# Patient Record
Sex: Female | Born: 1935
Health system: Southern US, Community
[De-identification: ages and names within clinical notes are randomized; demographics above are authoritative.]

## PROBLEM LIST (undated history)

## (undated) DIAGNOSIS — R638 Other symptoms and signs concerning food and fluid intake: Secondary | ICD-10-CM

## (undated) DIAGNOSIS — N189 Chronic kidney disease, unspecified: Secondary | ICD-10-CM

## (undated) DIAGNOSIS — N816 Rectocele: Secondary | ICD-10-CM

## (undated) DIAGNOSIS — K5790 Diverticulosis of intestine, part unspecified, without perforation or abscess without bleeding: Secondary | ICD-10-CM

## (undated) DIAGNOSIS — S62109A Fracture of unspecified carpal bone, unspecified wrist, initial encounter for closed fracture: Secondary | ICD-10-CM

## (undated) DIAGNOSIS — D571 Sickle-cell disease without crisis: Secondary | ICD-10-CM

## (undated) DIAGNOSIS — Z87828 Personal history of other (healed) physical injury and trauma: Secondary | ICD-10-CM

## (undated) DIAGNOSIS — I10 Essential (primary) hypertension: Secondary | ICD-10-CM

## (undated) DIAGNOSIS — E785 Hyperlipidemia, unspecified: Secondary | ICD-10-CM

## (undated) DIAGNOSIS — M1990 Unspecified osteoarthritis, unspecified site: Secondary | ICD-10-CM

## (undated) HISTORY — DX: Rectocele: N81.6

## (undated) HISTORY — PX: JOINT REPLACEMENT: SHX530

## (undated) HISTORY — PX: APPENDECTOMY: SHX54

## (undated) HISTORY — DX: Diverticulosis of intestine, part unspecified, without perforation or abscess without bleeding: K57.90

## (undated) HISTORY — PX: COLONOSCOPY: SHX174

## (undated) HISTORY — DX: Chronic kidney disease, unspecified: N18.9

## (undated) HISTORY — DX: Fracture of unspecified carpal bone, unspecified wrist, initial encounter for closed fracture: S62.109A

## (undated) HISTORY — DX: Hyperlipidemia, unspecified: E78.5

## (undated) HISTORY — DX: Essential (primary) hypertension: I10

## (undated) HISTORY — PX: CATARACT EXTRACTION, BILATERAL: SHX1313

## (undated) HISTORY — DX: Personal history of other (healed) physical injury and trauma: Z87.828

## (undated) HISTORY — DX: Other symptoms and signs concerning food and fluid intake: R63.8

## (undated) HISTORY — PX: HYSTEROTOMY: SHX1776

## (undated) HISTORY — DX: Sickle-cell disease without crisis: D57.1

## (undated) HISTORY — DX: Unspecified osteoarthritis, unspecified site: M19.90

## (undated) HISTORY — PX: ABDOMINAL HYSTERECTOMY: SHX81

---

## 2007-03-30 HISTORY — PX: CATARACT EXTRACTION, BILATERAL: SHX1313

## 2007-12-27 ENCOUNTER — Ambulatory Visit: Payer: Self-pay | Admitting: Internal Medicine

## 2008-03-29 HISTORY — PX: TOTAL HIP ARTHROPLASTY: SHX124

## 2008-08-12 ENCOUNTER — Ambulatory Visit: Payer: Self-pay | Admitting: Cardiology

## 2008-08-12 ENCOUNTER — Ambulatory Visit: Payer: Self-pay | Admitting: Specialist

## 2008-08-21 ENCOUNTER — Inpatient Hospital Stay: Payer: Self-pay | Admitting: Specialist

## 2008-08-23 ENCOUNTER — Encounter: Payer: Self-pay | Admitting: Internal Medicine

## 2008-08-27 ENCOUNTER — Encounter: Payer: Self-pay | Admitting: Internal Medicine

## 2008-09-03 ENCOUNTER — Ambulatory Visit: Payer: Self-pay | Admitting: Internal Medicine

## 2008-09-26 ENCOUNTER — Encounter: Payer: Self-pay | Admitting: Internal Medicine

## 2008-10-09 ENCOUNTER — Ambulatory Visit: Payer: Self-pay | Admitting: Internal Medicine

## 2009-02-12 ENCOUNTER — Ambulatory Visit: Payer: Self-pay | Admitting: Internal Medicine

## 2009-06-02 ENCOUNTER — Ambulatory Visit: Payer: Self-pay | Admitting: Gastroenterology

## 2009-06-03 ENCOUNTER — Ambulatory Visit: Payer: Self-pay | Admitting: Gastroenterology

## 2009-06-10 ENCOUNTER — Ambulatory Visit: Payer: Self-pay | Admitting: Gastroenterology

## 2010-02-13 ENCOUNTER — Ambulatory Visit: Payer: Self-pay | Admitting: Internal Medicine

## 2011-02-16 ENCOUNTER — Ambulatory Visit: Payer: Self-pay | Admitting: Internal Medicine

## 2012-02-18 ENCOUNTER — Ambulatory Visit: Payer: Self-pay | Admitting: Internal Medicine

## 2012-12-27 ENCOUNTER — Ambulatory Visit: Payer: Self-pay | Admitting: Physician Assistant

## 2013-02-19 ENCOUNTER — Ambulatory Visit: Payer: Self-pay | Admitting: Internal Medicine

## 2013-10-31 ENCOUNTER — Ambulatory Visit: Payer: Self-pay | Admitting: Internal Medicine

## 2013-12-13 ENCOUNTER — Ambulatory Visit (INDEPENDENT_AMBULATORY_CARE_PROVIDER_SITE_OTHER): Payer: Medicare Other | Admitting: Cardiovascular Disease

## 2013-12-13 ENCOUNTER — Encounter (INDEPENDENT_AMBULATORY_CARE_PROVIDER_SITE_OTHER): Payer: Self-pay

## 2013-12-13 ENCOUNTER — Encounter: Payer: Self-pay | Admitting: Cardiovascular Disease

## 2013-12-13 VITALS — BP 122/70 | HR 60 | Ht 65.0 in | Wt 223.0 lb

## 2013-12-13 DIAGNOSIS — I491 Atrial premature depolarization: Secondary | ICD-10-CM | POA: Insufficient documentation

## 2013-12-13 DIAGNOSIS — M1990 Unspecified osteoarthritis, unspecified site: Secondary | ICD-10-CM | POA: Insufficient documentation

## 2013-12-13 DIAGNOSIS — R5383 Other fatigue: Secondary | ICD-10-CM

## 2013-12-13 DIAGNOSIS — IMO0002 Reserved for concepts with insufficient information to code with codable children: Secondary | ICD-10-CM

## 2013-12-13 DIAGNOSIS — M170 Bilateral primary osteoarthritis of knee: Secondary | ICD-10-CM

## 2013-12-13 DIAGNOSIS — M1710 Unilateral primary osteoarthritis, unspecified knee: Secondary | ICD-10-CM

## 2013-12-13 DIAGNOSIS — I499 Cardiac arrhythmia, unspecified: Secondary | ICD-10-CM

## 2013-12-13 DIAGNOSIS — I10 Essential (primary) hypertension: Secondary | ICD-10-CM | POA: Insufficient documentation

## 2013-12-13 DIAGNOSIS — E785 Hyperlipidemia, unspecified: Secondary | ICD-10-CM | POA: Insufficient documentation

## 2013-12-13 DIAGNOSIS — R5381 Other malaise: Secondary | ICD-10-CM

## 2013-12-13 MED ORDER — ATORVASTATIN CALCIUM 10 MG PO TABS: 10.0000 mg | ORAL_TABLET | Freq: Every day | ORAL | Status: DC

## 2013-12-13 NOTE — Assessment & Plan Note (Addendum)
Was been much of her visit talking about her cholesterol. Long discussion with her daughter who is with her today. They would like to treat this more aggressively. We'll start her on Lipitor 10 mg daily. Lab work to be done through our office or through Dr. Dareen Piano in followup. We also recommended she start a regular walking program and closely monitor her diet in an effort to lose weight.

## 2013-12-13 NOTE — Assessment & Plan Note (Signed)
Blood pressure is well controlled on today's visit. No changes made to the medications. 

## 2013-12-13 NOTE — Progress Notes (Signed)
[name], female    DOB: 09-10-1932, 80 y.o.   MRN: [id number]  HPI Comments: Briana Howe is a very pleasant 80 year old woman with a history of hyperlipidemia, hypertension, obesity who presents for evaluation of abnormal heart sounds and preoperative evaluation prior to colonoscopy. She is a patient of Dr. Dareen Piano.  She reports that she was recently seen Dr. Servando Snare for abdominal discomfort and on exam he appreciated an abnormal heart rhythm. She presents for followup of this arrhythmia. She denies any palpitations or tachycardia concerning for arrhythmia. She has no shortness of breath, no significant lower extremity edema. Overall she feels. She denies any significant chest pain. She does not participate in a regular exercise program. Daughter presents with her today. Other issues discussed included her EKG and cholesterol Cholesterol is 235 with LDL of 170. This is hard and she remembers it being previously. Daughter is quite alarmed that it is so high.  She reports that she was previously on Protonix for one month but this had side effects and she has stopped the medication. Since then she has not had any GERD symptoms She does report having some arthritis in her knee  Fasting glucose 86, creatinine 0.9, normal LFTs, total cholesterol 235, HDL 49, LDL 170  EKG in 2014 shows normal sinus rhythm with APCs otherwise no significant ST or T wave changes EKG today shows normal sinus rhythm with rate 60 beats per minute her, APCs noted with no significant ST or T wave changes   Outpatient Encounter Prescriptions as of 12/13/2013  Medication Sig  . aspirin EC 81 MG tablet Take by mouth.  . Calcium Carbonate (CALCIUM 600 PO) Take by mouth. Take 600 with vitamin d 800 units daily.  Marland Kitchen lisinopril-hydrochlorothiazide (PRINZIDE,ZESTORETIC) 20-25 MG per tablet Take by mouth daily.   . Omega-3 Fatty Acids (OMEGA-3 FISH OIL) 1200 MG CAPS Take 1,200 mg by mouth daily.  .  [DISCONTINUED] Cholecalciferol (VITAMIN D3) 2000 UNITS capsule Take by mouth.  . [DISCONTINUED] pantoprazole (PROTONIX) 40 MG tablet Take 40 mg by mouth daily.      Review of Systems  Constitutional: Negative.   HENT: Negative.   Eyes: Negative.   Respiratory: Negative.   Cardiovascular: Negative.   Gastrointestinal: Negative.   Endocrine: Negative.   Musculoskeletal: Negative.   Skin: Negative.   Allergic/Immunologic: Negative.   Neurological: Negative.   Hematological: Negative.   Psychiatric/Behavioral: Negative.   All other systems reviewed and are negative.   BP 122/70  Pulse 60  Ht  (1.651 m)  Wt 223 lb (101.152 kg)  BMI 37.11 kg/m2  Physical Exam  Nursing note and vitals reviewed. Constitutional: She is oriented to person, place, and time. She appears well-developed and well-nourished.  Obese  HENT:  Head: Normocephalic.  Nose: Nose normal.  Mouth/Throat: Oropharynx is clear and moist.  Eyes: Conjunctivae are normal. Pupils are equal, round, and reactive to light.  Neck: Normal range of motion. Neck supple. No JVD present.  Cardiovascular: Normal rate, regular rhythm, S1 normal, S2 normal, normal heart sounds and intact distal pulses.  Exam reveals no gallop and no friction rub.   No murmur heard. Pulmonary/Chest: Effort normal and breath sounds normal. No respiratory distress. She has no wheezes. She has no rales. She exhibits no tenderness.  Abdominal: Soft. Bowel sounds are normal. She exhibits no distension. There is no tenderness.  Musculoskeletal: Normal range of motion. She exhibits no edema and no tenderness.  Lymphadenopathy:    She has no  cervical adenopathy.  Neurological: She is alert and oriented to person, place, and time. Coordination normal.  Skin: Skin is warm and dry. No rash noted. No erythema.  Psychiatric: She has a normal mood and affect. Her behavior is normal. Judgment and thought content normal.    Assessment and Plan

## 2013-12-13 NOTE — Assessment & Plan Note (Signed)
We have encouraged continued exercise, careful diet management in an effort to lose weight. 

## 2013-12-13 NOTE — Patient Instructions (Signed)
You are doing well.  Please start the atorvastatin/lipitor 10 mg once a day  Please call us if you have new issues that need to be addressed before your next appt.

## 2013-12-13 NOTE — Assessment & Plan Note (Signed)
Abnormal heart rhythm on auscultation by Dr. Servando Snare most likely secondary to APCs. Seen on EKG today and in 2014. We did discuss workup including a Holter monitor. As she is asymptomatic, we'll monitor her for now. She does not want beta blockers as she has no symptoms. Otherwise heart rate is well controlled with no EKG abnormalities

## 2014-01-09 ENCOUNTER — Ambulatory Visit: Payer: Self-pay | Admitting: Gastroenterology

## 2014-01-10 ENCOUNTER — Ambulatory Visit: Payer: Self-pay | Admitting: Gastroenterology

## 2014-01-10 LAB — PATHOLOGY REPORT

## 2014-01-11 ENCOUNTER — Ambulatory Visit: Payer: Self-pay | Admitting: Gastroenterology

## 2014-02-25 ENCOUNTER — Ambulatory Visit: Payer: Self-pay | Admitting: Internal Medicine

## 2014-03-12 ENCOUNTER — Ambulatory Visit: Payer: Self-pay | Admitting: Gastroenterology

## 2014-03-29 LAB — HM MAMMOGRAPHY

## 2014-03-29 LAB — HM COLONOSCOPY

## 2014-05-13 ENCOUNTER — Other Ambulatory Visit: Payer: Self-pay

## 2014-05-16 ENCOUNTER — Other Ambulatory Visit (INDEPENDENT_AMBULATORY_CARE_PROVIDER_SITE_OTHER): Payer: Medicare Other | Admitting: *Deleted

## 2014-05-16 DIAGNOSIS — E785 Hyperlipidemia, unspecified: Secondary | ICD-10-CM

## 2014-05-17 LAB — LIPID PANEL
CHOL/HDL RATIO: 2.5 ratio (ref 0.0–4.4)
Cholesterol, Total: 159 mg/dL (ref 100–199)
HDL: 63 mg/dL (ref >39)
LDL Calculated: 85 mg/dL (ref 0–99)
Triglycerides: 56 mg/dL (ref 0–149)
VLDL Cholesterol Cal: 11 mg/dL (ref 5–40)

## 2014-05-17 LAB — HEPATIC FUNCTION PANEL
ALK PHOS: 98 IU/L (ref 39–117)
ALT: 16 IU/L (ref 0–32)
AST: 22 IU/L (ref 0–40)
Albumin: 3.8 g/dL (ref 3.5–4.7)
BILIRUBIN, DIRECT: 0.14 mg/dL (ref 0.00–0.40)
Bilirubin Total: 0.4 mg/dL (ref 0.0–1.2)
Total Protein: 6.8 g/dL (ref 6.0–8.5)

## 2014-05-21 ENCOUNTER — Telehealth: Payer: Self-pay

## 2014-05-21 NOTE — Telephone Encounter (Signed)
Pt would like lab results.  

## 2014-05-21 NOTE — Telephone Encounter (Signed)
Please see result note 

## 2014-05-22 ENCOUNTER — Telehealth: Payer: Self-pay

## 2014-05-22 NOTE — Telephone Encounter (Signed)
Pt daughter would like lab results

## 2014-05-22 NOTE — Telephone Encounter (Signed)
Please see result note 

## 2014-12-17 ENCOUNTER — Other Ambulatory Visit: Payer: Self-pay | Admitting: Internal Medicine

## 2014-12-17 DIAGNOSIS — Z1231 Encounter for screening mammogram for malignant neoplasm of breast: Secondary | ICD-10-CM

## 2014-12-24 ENCOUNTER — Emergency Department
Admission: EM | Admit: 2014-12-24 | Discharge: 2014-12-24 | Disposition: A | Discharge status: Home / Self Care | Payer: Medicare Other | Attending: Emergency Medicine | Admitting: Emergency Medicine

## 2014-12-24 ENCOUNTER — Emergency Department: Payer: Medicare Other

## 2014-12-24 ENCOUNTER — Encounter: Payer: Self-pay | Admitting: Emergency Medicine

## 2014-12-24 ENCOUNTER — Other Ambulatory Visit: Payer: Self-pay

## 2014-12-24 DIAGNOSIS — I10 Essential (primary) hypertension: Secondary | ICD-10-CM | POA: Insufficient documentation

## 2014-12-24 DIAGNOSIS — Y92009 Unspecified place in unspecified non-institutional (private) residence as the place of occurrence of the external cause: Secondary | ICD-10-CM | POA: Diagnosis not present

## 2014-12-24 DIAGNOSIS — W01198A Fall on same level from slipping, tripping and stumbling with subsequent striking against other object, initial encounter: Secondary | ICD-10-CM | POA: Insufficient documentation

## 2014-12-24 DIAGNOSIS — Y9389 Activity, other specified: Secondary | ICD-10-CM | POA: Diagnosis not present

## 2014-12-24 DIAGNOSIS — S300XXA Contusion of lower back and pelvis, initial encounter: Secondary | ICD-10-CM | POA: Diagnosis not present

## 2014-12-24 DIAGNOSIS — Z7982 Long term (current) use of aspirin: Secondary | ICD-10-CM | POA: Diagnosis not present

## 2014-12-24 DIAGNOSIS — Y998 Other external cause status: Secondary | ICD-10-CM | POA: Insufficient documentation

## 2014-12-24 DIAGNOSIS — S0990XA Unspecified injury of head, initial encounter: Secondary | ICD-10-CM | POA: Insufficient documentation

## 2014-12-24 DIAGNOSIS — S7002XA Contusion of left hip, initial encounter: Secondary | ICD-10-CM | POA: Insufficient documentation

## 2014-12-24 DIAGNOSIS — S3991XA Unspecified injury of abdomen, initial encounter: Secondary | ICD-10-CM | POA: Insufficient documentation

## 2014-12-24 DIAGNOSIS — Z79899 Other long term (current) drug therapy: Secondary | ICD-10-CM | POA: Diagnosis not present

## 2014-12-24 LAB — BASIC METABOLIC PANEL
Anion gap: 9 (ref 5–15)
BUN: 22 mg/dL — ABNORMAL HIGH (ref 6–20)
CALCIUM: 9.4 mg/dL (ref 8.9–10.3)
CHLORIDE: 99 mmol/L — AB (ref 101–111)
CO2: 29 mmol/L (ref 22–32)
CREATININE: 0.92 mg/dL (ref 0.44–1.00)
GFR calc Af Amer: >60 mL/min (ref >60)
GFR calc non Af Amer: 57 mL/min — ABNORMAL LOW (ref >60)
Glucose, Bld: 88 mg/dL (ref 65–99)
Potassium: 3.6 mmol/L (ref 3.5–5.1)
SODIUM: 137 mmol/L (ref 135–145)

## 2014-12-24 LAB — CBC
HCT: 36.5 % (ref 35.0–47.0)
Hemoglobin: 12.9 g/dL (ref 12.0–16.0)
MCH: 32.1 pg (ref 26.0–34.0)
MCHC: 35.4 g/dL (ref 32.0–36.0)
MCV: 90.7 fL (ref 80.0–100.0)
PLATELETS: 230 10*3/uL (ref 150–440)
RBC: 4.03 MIL/uL (ref 3.80–5.20)
RDW: 14.2 % (ref 11.5–14.5)
WBC: 6.7 10*3/uL (ref 3.6–11.0)

## 2014-12-24 MED ORDER — TRAMADOL HCL 50 MG PO TABS: 50.0000 mg | ORAL_TABLET | Freq: Four times a day (QID) | ORAL | Status: AC | PRN

## 2014-12-24 MED ORDER — TRAMADOL HCL 50 MG PO TABS
50.0000 mg | ORAL_TABLET | Freq: Once | ORAL | Status: AC
  Administered 2014-12-24: 50 mg via ORAL
  Filled 2014-12-24: qty 1

## 2014-12-24 NOTE — ED Notes (Signed)
Pt arrived to the ED accompanied by her daughter after falling at home. Pt states that she was in the kitchen when she fell without knowing what made her fall. Pt states that she hit her head when she fell . Pt is AOx in triage. MD notified.
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
## 2014-12-24 NOTE — Discharge Instructions (Signed)
Head Injury °You have received a head injury. It does not appear serious at this time. Headaches and vomiting are common following head injury. It should be easy to awaken from sleeping. Sometimes it is necessary for you to stay in the emergency department for a while for observation. Sometimes admission to the hospital may be needed. After injuries such as yours, most problems occur within the first 24 hours, but side effects may occur up to 7-10 days after the injury. It is important for you to carefully monitor your condition and contact your health care provider or seek immediate medical care if there is a change in your condition. °WHAT ARE THE TYPES OF HEAD INJURIES? °Head injuries can be as minor as a bump. Some head injuries can be more severe. More severe head injuries include: °· A jarring injury to the brain (concussion). °· A bruise of the brain (contusion). This mean there is bleeding in the brain that can cause swelling. °· A cracked skull (skull fracture). °· Bleeding in the brain that collects, clots, and forms a bump (hematoma). °WHAT CAUSES A HEAD INJURY? °A serious head injury is most likely to happen to someone who is in a car wreck and is not wearing a seat belt. Other causes of major head injuries include bicycle or motorcycle accidents, sports injuries, and falls. °HOW ARE HEAD INJURIES DIAGNOSED? °A complete history of the event leading to the injury and your current symptoms will be helpful in diagnosing head injuries. Many times, pictures of the brain, such as CT or MRI are needed to see the extent of the injury. Often, an overnight hospital stay is necessary for observation.  °WHEN SHOULD I SEEK IMMEDIATE MEDICAL CARE?  °You should get help right away if: °· You have confusion or drowsiness. °· You feel sick to your stomach (nauseous) or have continued, forceful vomiting. °· You have dizziness or unsteadiness that is getting worse. °· You have severe, continued headaches not relieved by  medicine. Only take over-the-counter or prescription medicines for pain, fever, or discomfort as directed by your health care provider. °· You do not have normal function of the arms or legs or are unable to walk. °· You notice changes in the black spots in the center of the colored part of your eye (pupil). °· You have a clear or bloody fluid coming from your nose or ears. °· You have a loss of vision. °During the next 24 hours after the injury, you must stay with someone who can watch you for the warning signs. This person should contact local emergency services (911 in the [place] if you have seizures, you become unconscious, or you are unable to wake up. °HOW CAN I PREVENT A HEAD INJURY IN THE FUTURE? °The most important factor for preventing major head injuries is avoiding motor vehicle accidents.  To minimize the potential for damage to your head, it is crucial to wear seat belts while riding in motor vehicles. Wearing helmets while bike riding and playing collision sports (like football) is also helpful. Also, avoiding dangerous activities around the house will further help reduce your risk of head injury.  °WHEN CAN I RETURN TO NORMAL ACTIVITIES AND ATHLETICS? °You should be reevaluated by your health care provider before returning to these activities. If you have any of the following symptoms, you should not return to activities or contact sports until 1 week after the symptoms have stopped: °· Persistent headache. °· Dizziness or vertigo. °· Poor attention and concentration. °· Confusion. °·   Memory problems. °· Nausea or vomiting. °· Fatigue or tire easily. °· Irritability. °· Intolerant of bright lights or loud noises. °· Anxiety or depression. °· Disturbed sleep. °MAKE SURE YOU:  °· Understand these instructions. °· Will watch your condition. °· Will get help right away if you are not doing well or get worse. °Document Released: 03/15/2005 Document Revised: 03/20/2013 Document Reviewed:  11/20/2012 °[employer]® Patient Information ©2015 [employer]. This information is not intended to replace advice given to you by your health care provider. Make sure you discuss any questions you have with your health care provider. ° °Please return immediately if condition worsens. Please contact her primary physician or the physician you were given for referral. If you have any specialist physicians involved in her treatment and plan please also contact them. Thank you for using [facility]. ° °

## 2015-02-27 ENCOUNTER — Other Ambulatory Visit: Payer: Self-pay | Admitting: Internal Medicine

## 2015-02-27 ENCOUNTER — Ambulatory Visit
Admission: RE | Admit: 2015-02-27 | Discharge: 2015-02-27 | Disposition: A | Discharge status: Home / Self Care | Payer: Medicare Other | Source: Ambulatory Visit | Attending: Internal Medicine | Admitting: Internal Medicine

## 2015-02-27 DIAGNOSIS — Z1231 Encounter for screening mammogram for malignant neoplasm of breast: Secondary | ICD-10-CM | POA: Diagnosis not present

## 2015-06-23 LAB — HM PAP SMEAR

## 2015-07-08 ENCOUNTER — Ambulatory Visit (INDEPENDENT_AMBULATORY_CARE_PROVIDER_SITE_OTHER): Payer: Medicare Other | Admitting: Obstetrics and Gynecology

## 2015-07-08 ENCOUNTER — Encounter: Payer: Self-pay | Admitting: Obstetrics and Gynecology

## 2015-07-08 VITALS — BP 131/70 | HR 61 | Ht 65.0 in | Wt 227.5 lb

## 2015-07-08 DIAGNOSIS — I10 Essential (primary) hypertension: Secondary | ICD-10-CM

## 2015-07-08 DIAGNOSIS — Z9079 Acquired absence of other genital organ(s): Secondary | ICD-10-CM | POA: Diagnosis not present

## 2015-07-08 DIAGNOSIS — K5790 Diverticulosis of intestine, part unspecified, without perforation or abscess without bleeding: Secondary | ICD-10-CM | POA: Diagnosis not present

## 2015-07-08 DIAGNOSIS — Z90710 Acquired absence of both cervix and uterus: Secondary | ICD-10-CM | POA: Diagnosis not present

## 2015-07-08 DIAGNOSIS — Z780 Asymptomatic menopausal state: Secondary | ICD-10-CM | POA: Diagnosis not present

## 2015-07-08 DIAGNOSIS — N816 Rectocele: Secondary | ICD-10-CM

## 2015-07-08 DIAGNOSIS — E785 Hyperlipidemia, unspecified: Secondary | ICD-10-CM | POA: Diagnosis not present

## 2015-07-08 DIAGNOSIS — Z124 Encounter for screening for malignant neoplasm of cervix: Secondary | ICD-10-CM

## 2015-07-08 DIAGNOSIS — R638 Other symptoms and signs concerning food and fluid intake: Secondary | ICD-10-CM

## 2015-07-08 DIAGNOSIS — Z90722 Acquired absence of ovaries, bilateral: Secondary | ICD-10-CM

## 2015-07-08 DIAGNOSIS — Z1211 Encounter for screening for malignant neoplasm of colon: Secondary | ICD-10-CM
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
## 2015-07-08 NOTE — Addendum Note (Signed)
Addended by: Sharon SellerEFRANCESCO, Qiara Minetti on: 07/08/2015 10:06 AM   Modules accepted: Kipp BroodSmartSet

## 2015-07-08 NOTE — Patient Instructions (Signed)
1. No Pap smear needed 2. Mammogram is due in November 2017 3. stool guaiac cards are given due to  normal colonoscopy this past year 4. Continue with healthy eating and exercise 5. Return in 1 year

## 2015-08-01 ENCOUNTER — Telehealth: Payer: Self-pay | Admitting: Cardiovascular Disease

## 2015-08-01 NOTE — Telephone Encounter (Signed)
Pt has not been seen since 2015. She will need an appt before I can order any labs.

## 2015-08-01 NOTE — Telephone Encounter (Signed)
Spoke to patient and daughter Pt is coming in on 09/16/15 to see Dr Mariah MillingGollan

## 2015-08-01 NOTE — Telephone Encounter (Signed)
Pt daughter calling stating that they were told that If Dr Dareen PianoAnderson PCP Was not keeping up with liver functions that we need to schedule that here with us I don't see any orders Please advise.

## 2015-08-19 ENCOUNTER — Emergency Department: Payer: Medicare Other

## 2015-08-19 ENCOUNTER — Emergency Department
Admission: EM | Admit: 2015-08-19 | Discharge: 2015-08-19 | Disposition: A | Discharge status: Home / Self Care | Payer: Medicare Other | Attending: Emergency Medicine | Admitting: Emergency Medicine

## 2015-08-19 ENCOUNTER — Encounter: Payer: Self-pay | Admitting: Emergency Medicine

## 2015-08-19 DIAGNOSIS — Y929 Unspecified place or not applicable: Secondary | ICD-10-CM | POA: Insufficient documentation

## 2015-08-19 DIAGNOSIS — Y999 Unspecified external cause status: Secondary | ICD-10-CM | POA: Diagnosis not present

## 2015-08-19 DIAGNOSIS — W06XXXA Fall from bed, initial encounter: Secondary | ICD-10-CM | POA: Diagnosis not present

## 2015-08-19 DIAGNOSIS — I10 Essential (primary) hypertension: Secondary | ICD-10-CM | POA: Insufficient documentation

## 2015-08-19 DIAGNOSIS — W19XXXA Unspecified fall, initial encounter: Secondary | ICD-10-CM

## 2015-08-19 DIAGNOSIS — Y939 Activity, unspecified: Secondary | ICD-10-CM | POA: Diagnosis not present

## 2015-08-19 DIAGNOSIS — E785 Hyperlipidemia, unspecified: Secondary | ICD-10-CM | POA: Insufficient documentation

## 2015-08-19 DIAGNOSIS — Z91013 Allergy to seafood: Secondary | ICD-10-CM | POA: Insufficient documentation

## 2015-08-19 DIAGNOSIS — Z7982 Long term (current) use of aspirin: Secondary | ICD-10-CM | POA: Diagnosis not present

## 2015-08-19 DIAGNOSIS — M1990 Unspecified osteoarthritis, unspecified site: Secondary | ICD-10-CM | POA: Diagnosis not present

## 2015-08-19 DIAGNOSIS — M79605 Pain in left leg: Secondary | ICD-10-CM | POA: Diagnosis present

## 2015-08-19 DIAGNOSIS — Z79899 Other long term (current) drug therapy: Secondary | ICD-10-CM | POA: Diagnosis not present

## 2015-08-19 DIAGNOSIS — L03116 Cellulitis of left lower limb: Secondary | ICD-10-CM | POA: Diagnosis not present

## 2015-08-19 DIAGNOSIS — Z9660 Presence of unspecified orthopedic joint implant: Secondary | ICD-10-CM | POA: Diagnosis not present

## 2015-08-19 MED ORDER — SULFAMETHOXAZOLE-TRIMETHOPRIM 800-160 MG PO TABS
1.0000 | ORAL_TABLET | Freq: Once | ORAL | Status: AC
  Administered 2015-08-19: 1 via ORAL
  Filled 2015-08-19: qty 1

## 2015-08-19 MED ORDER — ACETAMINOPHEN 500 MG PO TABS
1000.0000 mg | ORAL_TABLET | Freq: Once | ORAL | Status: AC
  Administered 2015-08-19: 1000 mg via ORAL
  Filled 2015-08-19: qty 2

## 2015-08-19 MED ORDER — SULFAMETHOXAZOLE-TRIMETHOPRIM 800-160 MG PO TABS: 1.0000 | ORAL_TABLET | Freq: Two times a day (BID) | ORAL | Status: DC
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
## 2015-08-19 NOTE — ED Notes (Signed)
Pt leg elevated above heart per MD order.

## 2015-08-19 NOTE — Discharge Instructions (Signed)
Please keep your left leg elevated above the level of your heart as much as possible. You may also ice your leg for 10 minutes every 2 hours to decrease swelling and pain. For pain, you may take Tylenol. Please take the entire course of antibiotics, even if you're feeling better.  Please return to the emergency department if you develop increased swelling or pain, increasing redness from the marks that we made today, chest pain or shortness of breath, fever, nausea or vomiting, or any other symptoms concerning to you.  Cellulitis Cellulitis is an infection of the skin and the tissue beneath it. The infected area is usually red and tender. Cellulitis occurs most often in the arms and lower legs.  CAUSES  Cellulitis is caused by bacteria that enter the skin through cracks or cuts in the skin. The most common types of bacteria that cause cellulitis are staphylococci and streptococci. SIGNS AND SYMPTOMS   Redness and warmth.  Swelling.  Tenderness or pain.  Fever. DIAGNOSIS  Your health care provider can usually determine what is wrong based on a physical exam. Blood tests may also be done. TREATMENT  Treatment usually involves taking an antibiotic medicine. HOME CARE INSTRUCTIONS   Take your antibiotic medicine as directed by your health care provider. Finish the antibiotic even if you start to feel better.  Keep the infected arm or leg elevated to reduce swelling.  Apply a warm cloth to the affected area up to 4 times per day to relieve pain.  Take medicines only as directed by your health care provider.  Keep all follow-up visits as directed by your health care provider. SEEK MEDICAL CARE IF:   You notice red streaks coming from the infected area.  Your red area gets larger or turns dark in color.  Your bone or joint underneath the infected area becomes painful after the skin has healed.  Your infection returns in the same area or another area.  You notice a swollen bump in  the infected area.  You develop new symptoms.  You have a fever. SEEK IMMEDIATE MEDICAL CARE IF:   You feel very sleepy.  You develop vomiting or diarrhea.  You have a general ill feeling (malaise) with muscle aches and pains.   This information is not intended to replace advice given to you by your health care provider. Make sure you discuss any questions you have with your health care provider.   Document Released: 12/23/2004 Document Revised: 12/04/2014 Document Reviewed: 05/31/2011 Elsevier Interactive Patient Education [employer]

## 2015-08-19 NOTE — ED Notes (Signed)
Pt in radiology at this time. Family in treatment room.

## 2015-08-19 NOTE — ED Notes (Signed)
Patient presents to the ED with left lower leg pain, swelling and redness.  Patient states symptoms began two days ago after patient fell and injured her leg.  Patient denies history of blood clots.

## 2015-08-19 NOTE — ED Notes (Signed)
Patient transported to Ultrasound 

## 2015-09-16 ENCOUNTER — Ambulatory Visit (INDEPENDENT_AMBULATORY_CARE_PROVIDER_SITE_OTHER): Payer: Medicare Other | Admitting: Cardiovascular Disease

## 2015-09-16 ENCOUNTER — Encounter: Payer: Self-pay | Admitting: Cardiovascular Disease

## 2015-09-16 VITALS — BP 140/75 | HR 58 | Ht 65.0 in | Wt 228.1 lb

## 2015-09-16 DIAGNOSIS — I10 Essential (primary) hypertension: Secondary | ICD-10-CM

## 2015-09-16 DIAGNOSIS — I491 Atrial premature depolarization: Secondary | ICD-10-CM

## 2015-09-16 DIAGNOSIS — E785 Hyperlipidemia, unspecified: Secondary | ICD-10-CM
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
## 2015-09-16 NOTE — Patient Instructions (Signed)
You are doing well.  Potassium is running low normal (3.5 to 3.8) We want 4 to 5 on the potassium Take over the counter potassium pill one a day or every other day  Please call us if you have new issues that need to be addressed before your next appt.  Your physician wants you to follow-up in: 12 months.  You will receive a reminder letter in the mail two months in advance. If you don't receive a letter, please call our office to schedule the follow-up appointment.

## 2015-12-29 ENCOUNTER — Other Ambulatory Visit: Payer: Self-pay | Admitting: Internal Medicine

## 2015-12-29 DIAGNOSIS — Z1231 Encounter for screening mammogram for malignant neoplasm of breast: Secondary | ICD-10-CM

## 2016-03-01 ENCOUNTER — Ambulatory Visit
Admission: RE | Admit: 2016-03-01 | Discharge: 2016-03-01 | Disposition: A | Discharge status: Home / Self Care | Payer: Medicare Other | Source: Ambulatory Visit | Attending: Internal Medicine | Admitting: Internal Medicine

## 2016-03-01 DIAGNOSIS — Z1231 Encounter for screening mammogram for malignant neoplasm of breast: Secondary | ICD-10-CM | POA: Diagnosis not present

## 2016-04-05 DIAGNOSIS — M25571 Pain in right ankle and joints of right foot: Secondary | ICD-10-CM | POA: Diagnosis not present

## 2016-04-11 DIAGNOSIS — R05 Cough: Secondary | ICD-10-CM | POA: Diagnosis not present

## 2016-04-11 DIAGNOSIS — J40 Bronchitis, not specified as acute or chronic: Secondary | ICD-10-CM | POA: Diagnosis not present

## 2016-06-15 DIAGNOSIS — I10 Essential (primary) hypertension: Secondary | ICD-10-CM | POA: Diagnosis not present

## 2016-06-15 DIAGNOSIS — Z0000 Encounter for general adult medical examination without abnormal findings: Secondary | ICD-10-CM | POA: Diagnosis not present

## 2016-06-15 DIAGNOSIS — E7800 Pure hypercholesterolemia, unspecified: Secondary | ICD-10-CM | POA: Diagnosis not present

## 2016-06-22 DIAGNOSIS — E7800 Pure hypercholesterolemia, unspecified: Secondary | ICD-10-CM | POA: Diagnosis not present

## 2016-06-22 DIAGNOSIS — I10 Essential (primary) hypertension: Secondary | ICD-10-CM | POA: Diagnosis not present

## 2016-06-22 DIAGNOSIS — J3089 Other allergic rhinitis: Secondary | ICD-10-CM | POA: Diagnosis not present

## 2016-06-22 DIAGNOSIS — M1990 Unspecified osteoarthritis, unspecified site: Secondary | ICD-10-CM | POA: Diagnosis not present

## 2016-06-23 DIAGNOSIS — Z961 Presence of intraocular lens: Secondary | ICD-10-CM | POA: Diagnosis not present
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
## 2016-07-08 ENCOUNTER — Encounter: Payer: Self-pay | Admitting: Obstetrics and Gynecology

## 2016-07-08 ENCOUNTER — Ambulatory Visit (INDEPENDENT_AMBULATORY_CARE_PROVIDER_SITE_OTHER): Payer: PPO | Admitting: Obstetrics and Gynecology

## 2016-07-08 VITALS — BP 123/67 | HR 51 | Ht 65.0 in | Wt 227.5 lb

## 2016-07-08 DIAGNOSIS — Z1211 Encounter for screening for malignant neoplasm of colon: Secondary | ICD-10-CM

## 2016-07-08 DIAGNOSIS — Z90722 Acquired absence of ovaries, bilateral: Secondary | ICD-10-CM

## 2016-07-08 DIAGNOSIS — Z780 Asymptomatic menopausal state: Secondary | ICD-10-CM

## 2016-07-08 DIAGNOSIS — R638 Other symptoms and signs concerning food and fluid intake: Secondary | ICD-10-CM

## 2016-07-08 DIAGNOSIS — Z9079 Acquired absence of other genital organ(s): Secondary | ICD-10-CM

## 2016-07-08 DIAGNOSIS — Z90710 Acquired absence of both cervix and uterus: Secondary | ICD-10-CM

## 2016-07-08 DIAGNOSIS — K5790 Diverticulosis of intestine, part unspecified, without perforation or abscess without bleeding: Secondary | ICD-10-CM

## 2016-07-08 DIAGNOSIS — E785 Hyperlipidemia, unspecified: Secondary | ICD-10-CM

## 2016-07-08 DIAGNOSIS — I10 Essential (primary) hypertension: Secondary | ICD-10-CM

## 2016-07-08 DIAGNOSIS — N816 Rectocele: Secondary | ICD-10-CM | POA: Diagnosis not present

## 2016-07-08 NOTE — Patient Instructions (Addendum)
1. No Pap smear is needed 2. Mammogram will be obtained through her primary care; patient may defer mammogram this year if desired due to normal exam and age 83. Stool guaiac card testing for colon cancer is deferred due to history of colonoscopy several years ago a normal and no current symptomatology 4. Screening labs will be obtained through PCP 5. Continue with calcium and vitamin D supplementation 6. Continue with healthy eating, exercise, and attempt at controlled weight loss 7. Return in 1 year   Health Maintenance for Postmenopausal Women Menopause is a normal process in which your reproductive ability comes to an end. This process happens gradually over a span of months to years, usually between the ages of 48 and 31. Menopause is complete when you have missed 12 consecutive menstrual periods. It is important to talk with your health care provider about some of the most common conditions that affect postmenopausal women, such as heart disease, cancer, and bone loss (osteoporosis). Adopting a healthy lifestyle and getting preventive care can help to promote your health and wellness. Those actions can also lower your chances of developing some of these common conditions. What should I know about menopause? During menopause, you may experience a number of symptoms, such as:  Moderate-to-severe hot flashes.  Night sweats.  Decrease in sex drive.  Mood swings.  Headaches.  Tiredness.  Irritability.  Memory problems.  Insomnia. Choosing to treat or not to treat menopausal changes is an individual decision that you make with your health care provider. What should I know about hormone replacement therapy and supplements? Hormone therapy products are effective for treating symptoms that are associated with menopause, such as hot flashes and night sweats. Hormone replacement carries certain risks, especially as you become older. If you are thinking about using estrogen or estrogen  with progestin treatments, discuss the benefits and risks with your health care provider. What should I know about heart disease and stroke? Heart disease, heart attack, and stroke become more likely as you age. This may be due, in part, to the hormonal changes that your body experiences during menopause. These can affect how your body processes dietary fats, triglycerides, and cholesterol. Heart attack and stroke are both medical emergencies. There are many things that you can do to help prevent heart disease and stroke:  Have your blood pressure checked at least every 1-2 years. High blood pressure causes heart disease and increases the risk of stroke.  If you are 78-79 years old, ask your health care provider if you should take aspirin to prevent a heart attack or a stroke.  Do not use any tobacco products, including cigarettes, chewing tobacco, or electronic cigarettes. If you need help quitting, ask your health care provider.  It is important to eat a healthy diet and maintain a healthy weight.  Be sure to include plenty of vegetables, fruits, low-fat dairy products, and lean protein.  Avoid eating foods that are high in solid fats, added sugars, or salt (sodium).  Get regular exercise. This is one of the most important things that you can do for your health.  Try to exercise for at least 150 minutes each week. The type of exercise that you do should increase your heart rate and make you sweat. This is known as moderate-intensity exercise.  Try to do strengthening exercises at least twice each week. Do these in addition to the moderate-intensity exercise.  Know your numbers.Ask your health care provider to check your cholesterol and your blood glucose. Continue  to have your blood tested as directed by your health care provider. What should I know about cancer screening? There are several types of cancer. Take the following steps to reduce your risk and to catch any cancer development  as early as possible. Breast Cancer  Practice breast self-awareness.  This means understanding how your breasts normally appear and feel.  It also means doing regular breast self-exams. Let your health care provider know about any changes, no matter how small.  If you are 63 or older, have a clinician do a breast exam (clinical breast exam or CBE) every year. Depending on your age, family history, and medical history, it may be recommended that you also have a yearly breast X-ray (mammogram).  If you have a family history of breast cancer, talk with your health care provider about genetic screening.  If you are at high risk for breast cancer, talk with your health care provider about having an MRI and a mammogram every year.  Breast cancer (BRCA) gene test is recommended for women who have family members with BRCA-related cancers. Results of the assessment will determine the need for genetic counseling and BRCA1 and for BRCA2 testing. BRCA-related cancers include these types:  Breast. This occurs in males or females.  Ovarian.  Tubal. This may also be called fallopian tube cancer.  Cancer of the abdominal or pelvic lining (peritoneal cancer).  Prostate.  Pancreatic. Cervical, Uterine, and Ovarian Cancer  Your health care provider may recommend that you be screened regularly for cancer of the pelvic organs. These include your ovaries, uterus, and vagina. This screening involves a pelvic exam, which includes checking for microscopic changes to the surface of your cervix (Pap test).  For women ages 21-65, health care providers may recommend a pelvic exam and a Pap test every three years. For women ages 55-65, they may recommend the Pap test and pelvic exam, combined with testing for human papilloma virus (HPV), every five years. Some types of HPV increase your risk of cervical cancer. Testing for HPV may also be done on women of any age who have unclear Pap test results.  Other health  care providers may not recommend any screening for nonpregnant women who are considered low risk for pelvic cancer and have no symptoms. Ask your health care provider if a screening pelvic exam is right for you.  If you have had past treatment for cervical cancer or a condition that could lead to cancer, you need Pap tests and screening for cancer for at least 20 years after your treatment. If Pap tests have been discontinued for you, your risk factors (such as having a new sexual partner) need to be reassessed to determine if you should start having screenings again. Some women have medical problems that increase the chance of getting cervical cancer. In these cases, your health care provider may recommend that you have screening and Pap tests more often.  If you have a family history of uterine cancer or ovarian cancer, talk with your health care provider about genetic screening.  If you have vaginal bleeding after reaching menopause, tell your health care provider.  There are currently no reliable tests available to screen for ovarian cancer. Lung Cancer  Lung cancer screening is recommended for adults 19-62 years old who are at high risk for lung cancer because of a history of smoking. A yearly low-dose CT scan of the lungs is recommended if you:  Currently smoke.  Have a history of at least 30 pack-years  of smoking and you currently smoke or have quit within the past 15 years. A pack-year is smoking an average of one pack of cigarettes per day for one year. Yearly screening should:  Continue until it has been 15 years since you quit.  Stop if you develop a health problem that would prevent you from having lung cancer treatment. Colorectal Cancer  This type of cancer can be detected and can often be prevented.  Routine colorectal cancer screening usually begins at age 57 and continues through age 17.  If you have risk factors for colon cancer, your health care provider may recommend  that you be screened at an earlier age.  If you have a family history of colorectal cancer, talk with your health care provider about genetic screening.  Your health care provider may also recommend using home test kits to check for hidden blood in your stool.  A small camera at the end of a tube can be used to examine your colon directly (sigmoidoscopy or colonoscopy). This is done to check for the earliest forms of colorectal cancer.  Direct examination of the colon should be repeated every 5-10 years until age 76. However, if early forms of precancerous polyps or small growths are found or if you have a family history or genetic risk for colorectal cancer, you may need to be screened more often. Skin Cancer  Check your skin from head to toe regularly.  Monitor any moles. Be sure to tell your health care provider:  About any new moles or changes in moles, especially if there is a change in a mole's shape or color.  If you have a mole that is larger than the size of a pencil eraser.  If any of your family members has a history of skin cancer, especially at a young age, talk with your health care provider about genetic screening.  Always use sunscreen. Apply sunscreen liberally and repeatedly throughout the day.  Whenever you are outside, protect yourself by wearing long sleeves, pants, a wide-brimmed hat, and sunglasses. What should I know about osteoporosis? Osteoporosis is a condition in which bone destruction happens more quickly than new bone creation. After menopause, you may be at an increased risk for osteoporosis. To help prevent osteoporosis or the bone fractures that can happen because of osteoporosis, the following is recommended:  If you are 23-31 years old, get at least 1,000 mg of calcium and at least 600 mg of vitamin D per day.  If you are older than age 24 but younger than age 11, get at least 1,200 mg of calcium and at least 600 mg of vitamin D per day.  If you are  older than age 45, get at least 1,200 mg of calcium and at least 800 mg of vitamin D per day. Smoking and excessive alcohol intake increase the risk of osteoporosis. Eat foods that are rich in calcium and vitamin D, and do weight-bearing exercises several times each week as directed by your health care provider. What should I know about how menopause affects my mental health? Depression may occur at any age, but it is more common as you become older. Common symptoms of depression include:  Low or sad mood.  Changes in sleep patterns.  Changes in appetite or eating patterns.  Feeling an overall lack of motivation or enjoyment of activities that you previously enjoyed.  Frequent crying spells. Talk with your health care provider if you think that you are experiencing depression. What should I  know about immunizations? It is important that you get and maintain your immunizations. These include:  Tetanus, diphtheria, and pertussis (Tdap) booster vaccine.  Influenza every year before the flu season begins.  Pneumonia vaccine.  Shingles vaccine. Your health care provider may also recommend other immunizations. This information is not intended to replace advice given to you by your health care provider. Make sure you discuss any questions you have with your health care provider. Document Released: 05/07/2005 Document Revised: 10/03/2015 Document Reviewed: 12/17/2014 Elsevier Interactive Patient Education  2017 [employer].

## 2016-08-30 DIAGNOSIS — M79604 Pain in right leg: Secondary | ICD-10-CM | POA: Diagnosis not present

## 2016-09-02 DIAGNOSIS — M4726 Other spondylosis with radiculopathy, lumbar region: Secondary | ICD-10-CM | POA: Diagnosis not present

## 2016-09-27 ENCOUNTER — Ambulatory Visit
Admission: RE | Admit: 2016-09-27 | Discharge: 2016-09-27 | Disposition: A | Discharge status: Home / Self Care | Payer: PPO | Source: Ambulatory Visit | Attending: Physician Assistant | Admitting: Physician Assistant

## 2016-09-27 ENCOUNTER — Other Ambulatory Visit: Payer: Self-pay | Admitting: Physician Assistant

## 2016-09-27 DIAGNOSIS — M7989 Other specified soft tissue disorders: Principal | ICD-10-CM

## 2016-09-27 DIAGNOSIS — M79661 Pain in right lower leg: Secondary | ICD-10-CM

## 2016-09-27 DIAGNOSIS — M79604 Pain in right leg: Secondary | ICD-10-CM | POA: Diagnosis not present

## 2016-09-27 DIAGNOSIS — M1711 Unilateral primary osteoarthritis, right knee: Secondary | ICD-10-CM | POA: Diagnosis not present
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
## 2016-10-08 ENCOUNTER — Ambulatory Visit (INDEPENDENT_AMBULATORY_CARE_PROVIDER_SITE_OTHER): Payer: PPO | Admitting: Cardiovascular Disease

## 2016-10-08 ENCOUNTER — Other Ambulatory Visit: Payer: Self-pay | Admitting: *Deleted

## 2016-10-08 ENCOUNTER — Encounter: Payer: Self-pay | Admitting: Cardiovascular Disease

## 2016-10-08 DIAGNOSIS — E782 Mixed hyperlipidemia: Secondary | ICD-10-CM

## 2016-10-08 DIAGNOSIS — I10 Essential (primary) hypertension: Secondary | ICD-10-CM

## 2016-10-08 DIAGNOSIS — I491 Atrial premature depolarization: Secondary | ICD-10-CM | POA: Diagnosis not present

## 2016-10-08 NOTE — Patient Outreach (Addendum)
1st attempt- 10/06/16- Telephone call to patient for follow up on [employer] Health Questionnaire. No answer to telephone and no option to leave voicemail. [phone]). 2nd attempt- 10/08/16- Telephone call to patient for follow up on [employer] Member Health Questionnaire. No answer to telephone and no option to leave voicemail  [phone]).  PLAN Attempt to reach pt next week  Irving ShowsJulie Hillery Bhalla [facility] CenterRNC, BSN [facility] Care Coordinator [phone]

## 2016-10-08 NOTE — Patient Instructions (Signed)

## 2016-10-11 DIAGNOSIS — M898X9 Other specified disorders of bone, unspecified site: Secondary | ICD-10-CM | POA: Diagnosis not present

## 2016-10-11 DIAGNOSIS — M79675 Pain in left toe(s): Secondary | ICD-10-CM | POA: Diagnosis not present

## 2016-10-11 DIAGNOSIS — M2580 Other specified joint disorders, unspecified joint: Secondary | ICD-10-CM | POA: Diagnosis not present

## 2016-10-11 DIAGNOSIS — M2041 Other hammer toe(s) (acquired), right foot: Secondary | ICD-10-CM | POA: Diagnosis not present

## 2016-10-11 DIAGNOSIS — M2042 Other hammer toe(s) (acquired), left foot: Secondary | ICD-10-CM | POA: Diagnosis not present

## 2016-10-11 DIAGNOSIS — M79674 Pain in right toe(s): Secondary | ICD-10-CM | POA: Diagnosis not present

## 2016-10-11 DIAGNOSIS — M7751 Other enthesopathy of right foot: Secondary | ICD-10-CM | POA: Diagnosis not present

## 2016-10-11 DIAGNOSIS — B351 Tinea unguium: Secondary | ICD-10-CM | POA: Diagnosis not present

## 2016-10-13 ENCOUNTER — Encounter: Payer: Self-pay | Admitting: *Deleted

## 2016-10-13 ENCOUNTER — Other Ambulatory Visit: Payer: Self-pay | Admitting: *Deleted

## 2016-10-13 NOTE — Patient Outreach (Signed)
Telephone call to patient for follow up on [employer] Health Questionnaire. No answer to telephone and no option to leave voicemail.  RN CM mailed unsuccessful outreach letter to patient's home.  Will keep case open 10 days.  Irving ShowsJulie Farmer [facility] ServicesRNC, BSN [facility] Community Care Coordinator [phone]

## 2016-10-25 ENCOUNTER — Other Ambulatory Visit: Payer: Self-pay | Admitting: *Deleted

## 2016-10-25 NOTE — Patient Outreach (Signed)
Pt did not respond to letter mailed to patient's home.  Case closed.  Ashlynne Shetterly RNC, BSN [facility] Community Care Coordinator [phone]   

## 2016-11-15 DIAGNOSIS — G8929 Other chronic pain: Secondary | ICD-10-CM | POA: Diagnosis not present

## 2016-11-15 DIAGNOSIS — M79604 Pain in right leg: Secondary | ICD-10-CM | POA: Diagnosis not present

## 2016-11-15 DIAGNOSIS — M25511 Pain in right shoulder: Secondary | ICD-10-CM | POA: Diagnosis not present

## 2016-11-15 DIAGNOSIS — R0981 Nasal congestion: Secondary | ICD-10-CM | POA: Diagnosis not present

## 2016-11-30 DIAGNOSIS — J0110 Acute frontal sinusitis, unspecified: Secondary | ICD-10-CM | POA: Diagnosis not present

## 2016-11-30 DIAGNOSIS — M792 Neuralgia and neuritis, unspecified: Secondary | ICD-10-CM | POA: Diagnosis not present

## 2016-11-30 DIAGNOSIS — M48061 Spinal stenosis, lumbar region without neurogenic claudication: Secondary | ICD-10-CM | POA: Diagnosis not present

## 2016-12-27 DIAGNOSIS — M1990 Unspecified osteoarthritis, unspecified site: Secondary | ICD-10-CM | POA: Diagnosis not present

## 2016-12-27 DIAGNOSIS — I10 Essential (primary) hypertension: Secondary | ICD-10-CM | POA: Diagnosis not present

## 2016-12-27 DIAGNOSIS — E7800 Pure hypercholesterolemia, unspecified: Secondary | ICD-10-CM | POA: Diagnosis not present

## 2016-12-28 DIAGNOSIS — E7800 Pure hypercholesterolemia, unspecified: Secondary | ICD-10-CM | POA: Diagnosis not present

## 2016-12-28 DIAGNOSIS — I10 Essential (primary) hypertension: Secondary | ICD-10-CM | POA: Diagnosis not present

## 2016-12-28 DIAGNOSIS — Z0000 Encounter for general adult medical examination without abnormal findings: Secondary | ICD-10-CM | POA: Diagnosis not present

## 2017-01-10 ENCOUNTER — Ambulatory Visit (INDEPENDENT_AMBULATORY_CARE_PROVIDER_SITE_OTHER): Payer: PPO | Admitting: Vascular Surgery

## 2017-01-10 ENCOUNTER — Encounter (INDEPENDENT_AMBULATORY_CARE_PROVIDER_SITE_OTHER): Payer: Self-pay | Admitting: Vascular Surgery

## 2017-01-10 VITALS — BP 122/74 | HR 64 | Resp 16 | Ht 65.0 in | Wt 221.8 lb

## 2017-01-10 DIAGNOSIS — M79606 Pain in leg, unspecified: Secondary | ICD-10-CM | POA: Insufficient documentation

## 2017-01-10 DIAGNOSIS — I739 Peripheral vascular disease, unspecified: Secondary | ICD-10-CM | POA: Diagnosis not present

## 2017-01-10 DIAGNOSIS — E782 Mixed hyperlipidemia: Secondary | ICD-10-CM | POA: Diagnosis not present

## 2017-01-10 DIAGNOSIS — M79605 Pain in left leg: Secondary | ICD-10-CM | POA: Diagnosis not present

## 2017-01-10 DIAGNOSIS — M79604 Pain in right leg: Secondary | ICD-10-CM
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
## 2017-01-24 ENCOUNTER — Other Ambulatory Visit: Payer: Self-pay | Admitting: Internal Medicine

## 2017-01-24 DIAGNOSIS — Z1231 Encounter for screening mammogram for malignant neoplasm of breast: Secondary | ICD-10-CM

## 2017-02-02 ENCOUNTER — Encounter (INDEPENDENT_AMBULATORY_CARE_PROVIDER_SITE_OTHER): Payer: Self-pay | Admitting: Vascular Surgery

## 2017-02-02 ENCOUNTER — Ambulatory Visit (INDEPENDENT_AMBULATORY_CARE_PROVIDER_SITE_OTHER): Payer: PPO | Admitting: Vascular Surgery

## 2017-02-02 ENCOUNTER — Encounter (INDEPENDENT_AMBULATORY_CARE_PROVIDER_SITE_OTHER): Payer: Self-pay

## 2017-02-02 ENCOUNTER — Ambulatory Visit (INDEPENDENT_AMBULATORY_CARE_PROVIDER_SITE_OTHER): Payer: PPO

## 2017-02-02 VITALS — BP 134/71 | HR 62 | Resp 16 | Wt 223.0 lb

## 2017-02-02 DIAGNOSIS — I739 Peripheral vascular disease, unspecified: Secondary | ICD-10-CM

## 2017-02-02 DIAGNOSIS — E782 Mixed hyperlipidemia: Secondary | ICD-10-CM

## 2017-02-02 DIAGNOSIS — M79605 Pain in left leg: Secondary | ICD-10-CM

## 2017-02-02 DIAGNOSIS — M79604 Pain in right leg: Secondary | ICD-10-CM | POA: Diagnosis not present
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
## 2017-02-22 DIAGNOSIS — Z961 Presence of intraocular lens: Secondary | ICD-10-CM | POA: Diagnosis not present

## 2017-03-02 ENCOUNTER — Ambulatory Visit
Admission: RE | Admit: 2017-03-02 | Discharge: 2017-03-02 | Disposition: A | Discharge status: Home / Self Care | Payer: PPO | Source: Ambulatory Visit | Attending: Internal Medicine | Admitting: Internal Medicine

## 2017-03-02 DIAGNOSIS — Z1231 Encounter for screening mammogram for malignant neoplasm of breast: Secondary | ICD-10-CM | POA: Insufficient documentation

## 2017-04-04 DIAGNOSIS — J019 Acute sinusitis, unspecified: Secondary | ICD-10-CM | POA: Diagnosis not present

## 2017-04-28 DIAGNOSIS — M25511 Pain in right shoulder: Secondary | ICD-10-CM | POA: Diagnosis not present

## 2017-05-24 DIAGNOSIS — H26492 Other secondary cataract, left eye: Secondary | ICD-10-CM | POA: Diagnosis not present

## 2017-05-26 DIAGNOSIS — H26492 Other secondary cataract, left eye: Secondary | ICD-10-CM | POA: Diagnosis not present

## 2017-06-21 DIAGNOSIS — E7800 Pure hypercholesterolemia, unspecified: Secondary | ICD-10-CM | POA: Diagnosis not present

## 2017-06-21 DIAGNOSIS — I10 Essential (primary) hypertension: Secondary | ICD-10-CM | POA: Diagnosis not present

## 2017-06-28 DIAGNOSIS — E7800 Pure hypercholesterolemia, unspecified: Secondary | ICD-10-CM | POA: Diagnosis not present

## 2017-06-28 DIAGNOSIS — J3089 Other allergic rhinitis: Secondary | ICD-10-CM | POA: Diagnosis not present

## 2017-06-28 DIAGNOSIS — I10 Essential (primary) hypertension: Secondary | ICD-10-CM | POA: Diagnosis not present

## 2017-06-28 DIAGNOSIS — M1990 Unspecified osteoarthritis, unspecified site: Secondary | ICD-10-CM | POA: Diagnosis not present

## 2017-10-17 DIAGNOSIS — J309 Allergic rhinitis, unspecified: Secondary | ICD-10-CM | POA: Diagnosis not present

## 2017-10-17 DIAGNOSIS — J3489 Other specified disorders of nose and nasal sinuses: Secondary | ICD-10-CM | POA: Diagnosis not present

## 2017-11-29 DIAGNOSIS — R51 Headache: Secondary | ICD-10-CM | POA: Diagnosis not present

## 2017-11-29 DIAGNOSIS — J3489 Other specified disorders of nose and nasal sinuses: Secondary | ICD-10-CM | POA: Diagnosis not present

## 2017-12-13 ENCOUNTER — Other Ambulatory Visit: Payer: Self-pay

## 2017-12-13 ENCOUNTER — Ambulatory Visit: Payer: PPO | Attending: Internal Medicine

## 2017-12-13 DIAGNOSIS — G8929 Other chronic pain: Secondary | ICD-10-CM | POA: Diagnosis not present

## 2017-12-13 DIAGNOSIS — M25511 Pain in right shoulder: Secondary | ICD-10-CM | POA: Diagnosis not present

## 2017-12-13 DIAGNOSIS — M25611 Stiffness of right shoulder, not elsewhere classified: Secondary | ICD-10-CM | POA: Diagnosis not present

## 2017-12-13 DIAGNOSIS — M6281 Muscle weakness (generalized): Secondary | ICD-10-CM | POA: Diagnosis not present

## 2017-12-13 NOTE — Patient Instructions (Signed)
Access Code: [email or web address]  URL: [email or web address]  Date: 12/13/2017  Prepared by: Precious BardMarina Chou Busler   Exercises  Seated Scapular Retraction - 10 reps - 1 sets - 5 hold - 1x daily - 7x weekly  Standing Median Nerve Glide - 10 reps - 1 sets - 5 hold - 1x daily - 7x weekly
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
## 2017-12-15 ENCOUNTER — Encounter: Payer: Self-pay | Admitting: Physical Therapy

## 2017-12-15 ENCOUNTER — Ambulatory Visit: Payer: PPO | Admitting: Physical Therapy

## 2017-12-15 DIAGNOSIS — M25611 Stiffness of right shoulder, not elsewhere classified: Secondary | ICD-10-CM

## 2017-12-15 DIAGNOSIS — M25511 Pain in right shoulder: Secondary | ICD-10-CM | POA: Diagnosis not present

## 2017-12-15 DIAGNOSIS — M6281 Muscle weakness (generalized): Secondary | ICD-10-CM

## 2017-12-15 DIAGNOSIS — G8929 Other chronic pain: Secondary | ICD-10-CM
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
## 2017-12-15 NOTE — Patient Instructions (Signed)
Access Code: [email or web address]  URL: [email or web address]  Date: 12/15/2017  Prepared by: Encarnacion ChuAshley Abashian   Exercises  Seated Scapular Retraction - 10 reps - 1 sets - 5 hold - 1x daily - 7x weekly  Standing Median Nerve Glide - 10 reps - 1 sets - 5 hold - 1x daily - 7x weekly  Seated Upper Trapezius Stretch - 3 reps - 3 sets - 20 hold - 1x daily - 7x weekly

## 2017-12-20 ENCOUNTER — Ambulatory Visit: Payer: PPO | Admitting: Physical Therapy

## 2017-12-20 ENCOUNTER — Encounter: Payer: Self-pay | Admitting: Physical Therapy

## 2017-12-20 DIAGNOSIS — M25511 Pain in right shoulder: Secondary | ICD-10-CM | POA: Diagnosis not present

## 2017-12-20 DIAGNOSIS — M25611 Stiffness of right shoulder, not elsewhere classified: Secondary | ICD-10-CM

## 2017-12-20 DIAGNOSIS — G8929 Other chronic pain: Secondary | ICD-10-CM

## 2017-12-20 DIAGNOSIS — M6281 Muscle weakness (generalized): Secondary | ICD-10-CM

## 2017-12-20 NOTE — Patient Instructions (Addendum)
  Shoulder Retraction   Tie band around door knob (sitting or standing, holding band in both hands) Facing chest height anchor, grasp ends of band and pull hands to chest, squeezing shoulder blades together. Hold _3-5seconds. Repeat _15 times. Do _2_ sessions per day. Safety Note: Be sure anchor is secure.  Copyright  VHI. All rights reserved.  Strengthening: Resisted Extension   Hold band in both hands, Pull arm back, elbow straight, squeezing shoulder blades, Repeat _15___ times per set. Do _2___ sets per session. Do __2__ sessions per day.  http://orth.exer.us/833   Copyright  VHI. All rights reserved.     Roll   Inhale and bring shoulders up, back, then exhale and relax shoulders down. Repeat _10__ times. Do _2-3__ times per day.  Copyright  VHI. All rights reserved.    Strengthening: Resisted External Rotation    Hold green tubing in right hand, elbow at side and forearm across body. Rotate forearm out. Repeat __10__ times per set. Do _2___ sets per session. Do __2__ sessions per day.  http://orth.exer.us/829   Copyright  VHI. All rights reserved.
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
## 2017-12-27 DIAGNOSIS — I10 Essential (primary) hypertension: Secondary | ICD-10-CM | POA: Diagnosis not present

## 2017-12-27 DIAGNOSIS — E7800 Pure hypercholesterolemia, unspecified: Secondary | ICD-10-CM | POA: Diagnosis not present

## 2017-12-29 ENCOUNTER — Ambulatory Visit: Payer: PPO | Attending: Internal Medicine

## 2017-12-29 DIAGNOSIS — M25611 Stiffness of right shoulder, not elsewhere classified: Secondary | ICD-10-CM | POA: Diagnosis not present

## 2017-12-29 DIAGNOSIS — M6281 Muscle weakness (generalized): Secondary | ICD-10-CM

## 2017-12-29 DIAGNOSIS — G8929 Other chronic pain: Secondary | ICD-10-CM | POA: Diagnosis not present

## 2017-12-29 DIAGNOSIS — M25511 Pain in right shoulder: Secondary | ICD-10-CM | POA: Insufficient documentation
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
## 2018-01-02 ENCOUNTER — Ambulatory Visit: Payer: PPO

## 2018-01-02 DIAGNOSIS — M25611 Stiffness of right shoulder, not elsewhere classified: Secondary | ICD-10-CM

## 2018-01-02 DIAGNOSIS — M6281 Muscle weakness (generalized): Secondary | ICD-10-CM

## 2018-01-02 DIAGNOSIS — M25511 Pain in right shoulder: Principal | ICD-10-CM

## 2018-01-02 DIAGNOSIS — G8929 Other chronic pain: Secondary | ICD-10-CM
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
## 2018-01-03 DIAGNOSIS — E7800 Pure hypercholesterolemia, unspecified: Secondary | ICD-10-CM | POA: Diagnosis not present

## 2018-01-03 DIAGNOSIS — M1990 Unspecified osteoarthritis, unspecified site: Secondary | ICD-10-CM | POA: Diagnosis not present

## 2018-01-03 DIAGNOSIS — I10 Essential (primary) hypertension: Secondary | ICD-10-CM | POA: Diagnosis not present

## 2018-01-03 DIAGNOSIS — Z0000 Encounter for general adult medical examination without abnormal findings: Secondary | ICD-10-CM | POA: Diagnosis not present

## 2018-01-04 ENCOUNTER — Encounter: Payer: Self-pay | Admitting: Physical Therapy

## 2018-01-04 ENCOUNTER — Ambulatory Visit: Payer: PPO | Admitting: Physical Therapy

## 2018-01-04 DIAGNOSIS — M6281 Muscle weakness (generalized): Secondary | ICD-10-CM

## 2018-01-04 DIAGNOSIS — M25511 Pain in right shoulder: Secondary | ICD-10-CM | POA: Diagnosis not present

## 2018-01-04 DIAGNOSIS — M25611 Stiffness of right shoulder, not elsewhere classified: Secondary | ICD-10-CM

## 2018-01-04 DIAGNOSIS — G8929 Other chronic pain: Secondary | ICD-10-CM
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
## 2018-01-10 ENCOUNTER — Encounter: Payer: Self-pay | Admitting: Physical Therapy

## 2018-01-10 ENCOUNTER — Ambulatory Visit: Payer: PPO | Admitting: Physical Therapy

## 2018-01-10 DIAGNOSIS — M25511 Pain in right shoulder: Secondary | ICD-10-CM | POA: Diagnosis not present

## 2018-01-10 DIAGNOSIS — M6281 Muscle weakness (generalized): Secondary | ICD-10-CM

## 2018-01-10 DIAGNOSIS — G8929 Other chronic pain: Secondary | ICD-10-CM

## 2018-01-10 DIAGNOSIS — M25611 Stiffness of right shoulder, not elsewhere classified: Secondary | ICD-10-CM
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
## 2018-01-12 ENCOUNTER — Encounter: Payer: Self-pay | Admitting: Physical Therapy

## 2018-01-12 ENCOUNTER — Ambulatory Visit: Payer: PPO | Admitting: Physical Therapy

## 2018-01-12 DIAGNOSIS — M25611 Stiffness of right shoulder, not elsewhere classified: Secondary | ICD-10-CM

## 2018-01-12 DIAGNOSIS — M6281 Muscle weakness (generalized): Secondary | ICD-10-CM

## 2018-01-12 DIAGNOSIS — M25511 Pain in right shoulder: Secondary | ICD-10-CM | POA: Diagnosis not present

## 2018-01-12 DIAGNOSIS — G8929 Other chronic pain: Secondary | ICD-10-CM
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
## 2018-01-16 ENCOUNTER — Ambulatory Visit: Payer: PPO

## 2018-01-16 DIAGNOSIS — M25511 Pain in right shoulder: Secondary | ICD-10-CM | POA: Diagnosis not present

## 2018-01-16 DIAGNOSIS — G8929 Other chronic pain: Secondary | ICD-10-CM

## 2018-01-16 DIAGNOSIS — M25611 Stiffness of right shoulder, not elsewhere classified: Secondary | ICD-10-CM
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
## 2018-01-18 ENCOUNTER — Ambulatory Visit: Payer: PPO | Admitting: Physical Therapy

## 2018-01-18 DIAGNOSIS — M25611 Stiffness of right shoulder, not elsewhere classified: Secondary | ICD-10-CM

## 2018-01-18 DIAGNOSIS — G8929 Other chronic pain: Secondary | ICD-10-CM

## 2018-01-18 DIAGNOSIS — M6281 Muscle weakness (generalized): Secondary | ICD-10-CM

## 2018-01-18 DIAGNOSIS — M25511 Pain in right shoulder: Secondary | ICD-10-CM | POA: Diagnosis not present
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
## 2018-01-23 ENCOUNTER — Ambulatory Visit: Payer: PPO

## 2018-01-23 ENCOUNTER — Other Ambulatory Visit: Payer: Self-pay | Admitting: Internal Medicine

## 2018-01-23 DIAGNOSIS — M25611 Stiffness of right shoulder, not elsewhere classified: Secondary | ICD-10-CM

## 2018-01-23 DIAGNOSIS — M25511 Pain in right shoulder: Principal | ICD-10-CM

## 2018-01-23 DIAGNOSIS — M6281 Muscle weakness (generalized): Secondary | ICD-10-CM

## 2018-01-23 DIAGNOSIS — Z1231 Encounter for screening mammogram for malignant neoplasm of breast: Secondary | ICD-10-CM

## 2018-01-23 DIAGNOSIS — G8929 Other chronic pain: Secondary | ICD-10-CM
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
## 2018-01-25 ENCOUNTER — Ambulatory Visit: Payer: PPO

## 2018-01-25 DIAGNOSIS — M25511 Pain in right shoulder: Secondary | ICD-10-CM | POA: Diagnosis not present

## 2018-01-25 DIAGNOSIS — M6281 Muscle weakness (generalized): Secondary | ICD-10-CM

## 2018-01-25 DIAGNOSIS — G8929 Other chronic pain: Secondary | ICD-10-CM

## 2018-01-25 DIAGNOSIS — M25611 Stiffness of right shoulder, not elsewhere classified: Secondary | ICD-10-CM
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
## 2018-01-30 ENCOUNTER — Ambulatory Visit: Payer: PPO | Attending: Internal Medicine

## 2018-01-30 DIAGNOSIS — M25511 Pain in right shoulder: Secondary | ICD-10-CM | POA: Insufficient documentation

## 2018-01-30 DIAGNOSIS — G8929 Other chronic pain: Secondary | ICD-10-CM | POA: Diagnosis not present

## 2018-01-30 DIAGNOSIS — M545 Low back pain: Secondary | ICD-10-CM | POA: Insufficient documentation

## 2018-01-30 DIAGNOSIS — M6281 Muscle weakness (generalized): Secondary | ICD-10-CM

## 2018-01-30 DIAGNOSIS — M25611 Stiffness of right shoulder, not elsewhere classified: Secondary | ICD-10-CM | POA: Diagnosis not present
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
## 2018-02-01 ENCOUNTER — Ambulatory Visit: Payer: PPO

## 2018-02-01 DIAGNOSIS — M25511 Pain in right shoulder: Secondary | ICD-10-CM | POA: Diagnosis not present

## 2018-02-01 DIAGNOSIS — M6281 Muscle weakness (generalized): Secondary | ICD-10-CM

## 2018-02-01 DIAGNOSIS — G8929 Other chronic pain: Secondary | ICD-10-CM

## 2018-02-01 DIAGNOSIS — M25611 Stiffness of right shoulder, not elsewhere classified: Secondary | ICD-10-CM
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
## 2018-02-08 ENCOUNTER — Ambulatory Visit: Payer: PPO | Admitting: Physical Therapy

## 2018-02-08 ENCOUNTER — Encounter: Payer: Self-pay | Admitting: Physical Therapy

## 2018-02-08 DIAGNOSIS — M6281 Muscle weakness (generalized): Secondary | ICD-10-CM

## 2018-02-08 DIAGNOSIS — G8929 Other chronic pain: Secondary | ICD-10-CM

## 2018-02-08 DIAGNOSIS — M25511 Pain in right shoulder: Secondary | ICD-10-CM | POA: Diagnosis not present

## 2018-02-08 DIAGNOSIS — M25611 Stiffness of right shoulder, not elsewhere classified: Secondary | ICD-10-CM
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
## 2018-02-08 NOTE — Patient Instructions (Signed)
Arm Curl    Sit or stand with feet shoulder width apart, arms straight down at sides, palms forward. Inhale, then exhale while slowly curling weights toward shoulders and keeping elbows touching torso. Slowly return to starting position. Repeat _10-15___ times per set. Do __2__ sets per session. Do __3__ sessions per week. May be done with 5# dumbbells, tubing or resistive band.  Copyright  VHI. All rights reserved.  Curl: Sitting (Machine)    Wrists straight, curl arms toward shoulders, keeping upper arms in contact with pad. Do 15# Do ___2_ sets. Complete _10-15___ repetitions.  http://st.exer.us/131   Copyright  VHI. All rights reserved.  Press: Standing    Face anchor in stride stance. Thumbs up, straighten arms, rotating to palms down. Repeat _10-15_ times per set. Do 2__ sets per session. Do 3__ sessions per week. Do 15#  http://tub.exer.us/224   Copyright  VHI. All rights reserved.  Arm: Rowing    Sit facing tubing at chest level, arms outstretched. From a slight forward lean, pulling arms back to chest, lean back. Repeat _10-15___ times. Do __2__ sessions per day. Do 15# Copyright  VHI. All rights reserved.  Pull-Down: Wide Grip (Cable)    Pull bar to base of neck. (either front or back of head) Do ___2_ sets. Complete __10-15__ repetitions. Do 15# http://st.exer.us/405   Copyright  VHI. All rights reserved.  PsychiatristBench Press (Machine)    Press to straight arms. Do __2__ sets. Complete 10-15____ repetitions. Do 10# http://st.exer.us/229   Copyright  VHI. All rights reserved.

## 2018-02-10 ENCOUNTER — Ambulatory Visit: Payer: PPO

## 2018-02-10 DIAGNOSIS — M25511 Pain in right shoulder: Secondary | ICD-10-CM | POA: Diagnosis not present

## 2018-02-10 DIAGNOSIS — M545 Low back pain, unspecified: Secondary | ICD-10-CM

## 2018-02-10 DIAGNOSIS — G8929 Other chronic pain: Secondary | ICD-10-CM

## 2018-02-10 DIAGNOSIS — M6281 Muscle weakness (generalized): Secondary | ICD-10-CM
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
## 2018-02-13 ENCOUNTER — Ambulatory Visit: Payer: PPO

## 2018-02-13 DIAGNOSIS — M6281 Muscle weakness (generalized): Secondary | ICD-10-CM

## 2018-02-13 DIAGNOSIS — M545 Low back pain: Principal | ICD-10-CM

## 2018-02-13 DIAGNOSIS — G8929 Other chronic pain: Secondary | ICD-10-CM

## 2018-02-13 DIAGNOSIS — M25511 Pain in right shoulder: Secondary | ICD-10-CM | POA: Diagnosis not present
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
## 2018-02-16 ENCOUNTER — Ambulatory Visit: Payer: PPO | Admitting: Physical Therapy

## 2018-02-16 DIAGNOSIS — M545 Low back pain, unspecified: Secondary | ICD-10-CM

## 2018-02-16 DIAGNOSIS — G8929 Other chronic pain: Secondary | ICD-10-CM

## 2018-02-16 DIAGNOSIS — M6281 Muscle weakness (generalized): Secondary | ICD-10-CM

## 2018-02-16 DIAGNOSIS — M25511 Pain in right shoulder: Secondary | ICD-10-CM | POA: Diagnosis not present
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
## 2018-02-22 ENCOUNTER — Ambulatory Visit: Payer: PPO

## 2018-02-22 DIAGNOSIS — M545 Low back pain, unspecified: Secondary | ICD-10-CM

## 2018-02-22 DIAGNOSIS — M25511 Pain in right shoulder: Secondary | ICD-10-CM | POA: Diagnosis not present

## 2018-02-22 DIAGNOSIS — M6281 Muscle weakness (generalized): Secondary | ICD-10-CM

## 2018-02-22 DIAGNOSIS — G8929 Other chronic pain: Secondary | ICD-10-CM

## 2018-02-22 NOTE — Patient Instructions (Signed)
Access Code: [email or web address]  URL: [email or web address]  Date: 02/22/2018  Prepared by: Ria CommentJason Johnedward Brodrick   Exercises  Supine Bridge - 10 reps - 2 sets - 3 seconds hold - 1x daily - 7x weekly
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
## 2018-03-02 ENCOUNTER — Ambulatory Visit: Payer: PPO | Attending: Internal Medicine

## 2018-03-02 DIAGNOSIS — M6281 Muscle weakness (generalized): Secondary | ICD-10-CM

## 2018-03-02 DIAGNOSIS — M545 Low back pain: Secondary | ICD-10-CM | POA: Diagnosis not present

## 2018-03-02 DIAGNOSIS — H43813 Vitreous degeneration, bilateral: Secondary | ICD-10-CM | POA: Diagnosis not present

## 2018-03-02 DIAGNOSIS — M25611 Stiffness of right shoulder, not elsewhere classified: Secondary | ICD-10-CM | POA: Insufficient documentation

## 2018-03-02 DIAGNOSIS — G8929 Other chronic pain: Secondary | ICD-10-CM | POA: Insufficient documentation

## 2018-03-02 DIAGNOSIS — M25511 Pain in right shoulder: Secondary | ICD-10-CM | POA: Diagnosis not present
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
## 2018-03-03 ENCOUNTER — Ambulatory Visit
Admission: RE | Admit: 2018-03-03 | Discharge: 2018-03-03 | Disposition: A | Discharge status: Home / Self Care | Payer: PPO | Source: Ambulatory Visit | Attending: Internal Medicine | Admitting: Internal Medicine

## 2018-03-03 DIAGNOSIS — Z1231 Encounter for screening mammogram for malignant neoplasm of breast: Secondary | ICD-10-CM | POA: Diagnosis not present

## 2018-03-08 ENCOUNTER — Ambulatory Visit: Payer: PPO | Admitting: Physical Therapy

## 2018-03-08 ENCOUNTER — Encounter: Payer: Self-pay | Admitting: Physical Therapy

## 2018-03-08 DIAGNOSIS — M545 Low back pain: Secondary | ICD-10-CM | POA: Diagnosis not present

## 2018-03-08 DIAGNOSIS — G8929 Other chronic pain: Secondary | ICD-10-CM

## 2018-03-08 DIAGNOSIS — M6281 Muscle weakness (generalized): Secondary | ICD-10-CM

## 2018-03-08 NOTE — Patient Instructions (Signed)
Login URL [email or web address]  Your Access Code [email or web address]
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
## 2018-03-10 ENCOUNTER — Ambulatory Visit: Payer: PPO

## 2018-03-13 ENCOUNTER — Ambulatory Visit: Payer: PPO | Admitting: Physical Therapy

## 2018-03-13 ENCOUNTER — Encounter: Payer: Self-pay | Admitting: Physical Therapy

## 2018-03-13 DIAGNOSIS — G8929 Other chronic pain: Secondary | ICD-10-CM

## 2018-03-13 DIAGNOSIS — M545 Low back pain: Secondary | ICD-10-CM | POA: Diagnosis not present

## 2018-03-13 DIAGNOSIS — M6281 Muscle weakness (generalized): Secondary | ICD-10-CM
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
## 2018-03-15 ENCOUNTER — Encounter: Payer: Self-pay | Admitting: Physical Therapy

## 2018-03-15 ENCOUNTER — Ambulatory Visit: Payer: PPO | Admitting: Physical Therapy

## 2018-03-15 DIAGNOSIS — M6281 Muscle weakness (generalized): Secondary | ICD-10-CM

## 2018-03-15 DIAGNOSIS — G8929 Other chronic pain: Secondary | ICD-10-CM

## 2018-03-15 DIAGNOSIS — M545 Low back pain, unspecified: Secondary | ICD-10-CM
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
## 2018-03-20 ENCOUNTER — Ambulatory Visit: Payer: PPO | Admitting: Physical Therapy

## 2018-03-20 ENCOUNTER — Encounter: Payer: Self-pay | Admitting: Physical Therapy

## 2018-03-20 DIAGNOSIS — M545 Low back pain, unspecified: Secondary | ICD-10-CM

## 2018-03-20 DIAGNOSIS — M6281 Muscle weakness (generalized): Secondary | ICD-10-CM

## 2018-03-20 DIAGNOSIS — M25511 Pain in right shoulder: Secondary | ICD-10-CM

## 2018-03-20 DIAGNOSIS — M25611 Stiffness of right shoulder, not elsewhere classified: Secondary | ICD-10-CM

## 2018-03-20 DIAGNOSIS — G8929 Other chronic pain: Secondary | ICD-10-CM
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
## 2018-03-23 ENCOUNTER — Encounter: Payer: Self-pay | Admitting: Physical Therapy

## 2018-03-23 ENCOUNTER — Ambulatory Visit: Payer: PPO | Admitting: Physical Therapy

## 2018-03-23 DIAGNOSIS — M545 Low back pain, unspecified: Secondary | ICD-10-CM

## 2018-03-23 DIAGNOSIS — G8929 Other chronic pain: Secondary | ICD-10-CM

## 2018-03-23 DIAGNOSIS — M6281 Muscle weakness (generalized): Secondary | ICD-10-CM
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
## 2018-03-27 ENCOUNTER — Ambulatory Visit: Payer: PPO | Admitting: Physical Therapy

## 2018-03-30 ENCOUNTER — Encounter: Payer: PPO | Admitting: Physical Therapy

## 2018-04-17 DIAGNOSIS — J209 Acute bronchitis, unspecified: Secondary | ICD-10-CM | POA: Diagnosis not present

## 2018-04-17 DIAGNOSIS — B9689 Other specified bacterial agents as the cause of diseases classified elsewhere: Secondary | ICD-10-CM | POA: Diagnosis not present

## 2018-04-17 DIAGNOSIS — J9811 Atelectasis: Secondary | ICD-10-CM | POA: Diagnosis not present

## 2018-04-17 DIAGNOSIS — R0789 Other chest pain: Secondary | ICD-10-CM | POA: Diagnosis not present

## 2018-04-17 DIAGNOSIS — J019 Acute sinusitis, unspecified: Secondary | ICD-10-CM | POA: Diagnosis not present

## 2018-04-27 DIAGNOSIS — M1990 Unspecified osteoarthritis, unspecified site: Secondary | ICD-10-CM | POA: Diagnosis not present

## 2018-04-27 DIAGNOSIS — M545 Low back pain: Secondary | ICD-10-CM | POA: Diagnosis not present

## 2018-06-27 DIAGNOSIS — E7800 Pure hypercholesterolemia, unspecified: Secondary | ICD-10-CM | POA: Diagnosis not present

## 2018-06-27 DIAGNOSIS — Z0000 Encounter for general adult medical examination without abnormal findings: Secondary | ICD-10-CM | POA: Diagnosis not present

## 2018-06-27 DIAGNOSIS — I10 Essential (primary) hypertension: Secondary | ICD-10-CM | POA: Diagnosis not present

## 2018-07-04 DIAGNOSIS — M1990 Unspecified osteoarthritis, unspecified site: Secondary | ICD-10-CM | POA: Diagnosis not present

## 2018-07-04 DIAGNOSIS — I10 Essential (primary) hypertension: Secondary | ICD-10-CM | POA: Diagnosis not present

## 2018-07-04 DIAGNOSIS — I6523 Occlusion and stenosis of bilateral carotid arteries: Secondary | ICD-10-CM | POA: Diagnosis not present

## 2018-07-04 DIAGNOSIS — I739 Peripheral vascular disease, unspecified: Secondary | ICD-10-CM | POA: Diagnosis not present

## 2018-07-04 DIAGNOSIS — E7800 Pure hypercholesterolemia, unspecified: Secondary | ICD-10-CM | POA: Diagnosis not present

## 2018-08-15 ENCOUNTER — Other Ambulatory Visit: Payer: Self-pay | Admitting: Orthopedic Surgery

## 2018-08-15 DIAGNOSIS — M47896 Other spondylosis, lumbar region: Secondary | ICD-10-CM

## 2018-08-25 ENCOUNTER — Other Ambulatory Visit: Payer: Self-pay

## 2018-08-25 ENCOUNTER — Ambulatory Visit: Payer: PPO | Attending: Orthopedic Surgery

## 2018-08-25 DIAGNOSIS — M6281 Muscle weakness (generalized): Secondary | ICD-10-CM | POA: Diagnosis not present

## 2018-08-25 DIAGNOSIS — M545 Low back pain: Secondary | ICD-10-CM | POA: Diagnosis not present

## 2018-08-25 DIAGNOSIS — M5441 Lumbago with sciatica, right side: Secondary | ICD-10-CM | POA: Insufficient documentation

## 2018-08-25 DIAGNOSIS — G8929 Other chronic pain: Secondary | ICD-10-CM | POA: Diagnosis not present

## 2018-08-25 NOTE — Patient Instructions (Signed)
Access Code: [email or web address]  URL: [email or web address]  Date: 08/25/2018  Prepared by: Ria Comment   Exercises Straight Leg Raise Nerve Flossing - 10 reps - 2 sets - 1x daily - 7x weekly Supine Single Knee to Chest Stretch - 3 reps - 30 seconds hold - 1x daily - 7x weekly Supine Piriformis Stretch with Foot on Ground - 3 reps - 30 seconds hold - 1x daily - 7x weekly
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
## 2018-08-28 ENCOUNTER — Other Ambulatory Visit: Payer: Self-pay

## 2018-08-28 ENCOUNTER — Ambulatory Visit: Payer: Medicare Other | Attending: Orthopedic Surgery

## 2018-08-28 DIAGNOSIS — M5441 Lumbago with sciatica, right side: Secondary | ICD-10-CM | POA: Insufficient documentation

## 2018-08-28 DIAGNOSIS — M6281 Muscle weakness (generalized): Secondary | ICD-10-CM | POA: Diagnosis present

## 2018-08-28 NOTE — Patient Instructions (Signed)
Access Code: [email or web address]  URL: [email or web address]  Date: 08/28/2018  Prepared by: Ria Comment   Exercises Straight Leg Raise Nerve Flossing - 10 reps - 2 sets - 1x daily - 7x weekly Supine Single Knee to Chest Stretch - 3 reps - 30 seconds hold - 1x daily - 7x weekly Supine Piriformis Stretch with Foot on Ground - 3 reps - 30 seconds hold - 1x daily - 7x weekly Supine Pelvic Tilt - 10 reps - 2 sets - 5 seconds hold - 1x daily - 7x weekly Hooklying Lumbar Rotation - 10 reps - 2 sets - 5 seconds hold - 1x daily - 7x weekly
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
## 2018-08-30 ENCOUNTER — Other Ambulatory Visit: Payer: Self-pay

## 2018-08-30 ENCOUNTER — Ambulatory Visit: Payer: Medicare Other

## 2018-08-30 DIAGNOSIS — M6281 Muscle weakness (generalized): Secondary | ICD-10-CM

## 2018-08-30 DIAGNOSIS — M5441 Lumbago with sciatica, right side: Secondary | ICD-10-CM
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
## 2018-08-31 ENCOUNTER — Ambulatory Visit
Admission: RE | Admit: 2018-08-31 | Discharge: 2018-08-31 | Disposition: A | Discharge status: Home / Self Care | Payer: Medicare Other | Source: Ambulatory Visit | Attending: Physician Assistant | Admitting: Physician Assistant

## 2018-08-31 ENCOUNTER — Other Ambulatory Visit: Payer: Self-pay

## 2018-08-31 ENCOUNTER — Other Ambulatory Visit: Payer: Self-pay | Admitting: Physician Assistant

## 2018-08-31 DIAGNOSIS — I83811 Varicose veins of right lower extremities with pain: Secondary | ICD-10-CM | POA: Insufficient documentation

## 2018-08-31 DIAGNOSIS — M79604 Pain in right leg: Secondary | ICD-10-CM

## 2018-09-04 ENCOUNTER — Other Ambulatory Visit: Payer: Self-pay

## 2018-09-04 ENCOUNTER — Ambulatory Visit: Payer: Medicare Other

## 2018-09-04 DIAGNOSIS — M6281 Muscle weakness (generalized): Secondary | ICD-10-CM | POA: Diagnosis not present

## 2018-09-04 DIAGNOSIS — M5441 Lumbago with sciatica, right side: Secondary | ICD-10-CM
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
## 2018-09-06 ENCOUNTER — Ambulatory Visit: Payer: Medicare Other

## 2018-09-06 ENCOUNTER — Other Ambulatory Visit: Payer: Self-pay

## 2018-09-06 DIAGNOSIS — M5441 Lumbago with sciatica, right side: Secondary | ICD-10-CM

## 2018-09-06 DIAGNOSIS — M6281 Muscle weakness (generalized): Secondary | ICD-10-CM | POA: Diagnosis not present
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
## 2018-09-11 ENCOUNTER — Ambulatory Visit: Payer: Medicare Other

## 2018-09-11 ENCOUNTER — Other Ambulatory Visit: Payer: Self-pay

## 2018-09-11 DIAGNOSIS — M6281 Muscle weakness (generalized): Secondary | ICD-10-CM

## 2018-09-11 DIAGNOSIS — M5441 Lumbago with sciatica, right side: Secondary | ICD-10-CM
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
## 2018-09-13 ENCOUNTER — Ambulatory Visit: Payer: Medicare Other

## 2018-09-13 ENCOUNTER — Other Ambulatory Visit: Payer: Self-pay

## 2018-09-13 DIAGNOSIS — M5441 Lumbago with sciatica, right side: Secondary | ICD-10-CM

## 2018-09-13 DIAGNOSIS — M6281 Muscle weakness (generalized): Secondary | ICD-10-CM
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
## 2018-09-18 ENCOUNTER — Other Ambulatory Visit: Payer: Self-pay

## 2018-09-18 ENCOUNTER — Ambulatory Visit: Payer: Medicare Other

## 2018-09-18 DIAGNOSIS — M6281 Muscle weakness (generalized): Secondary | ICD-10-CM | POA: Diagnosis not present

## 2018-09-18 DIAGNOSIS — M5441 Lumbago with sciatica, right side: Secondary | ICD-10-CM
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
## 2018-09-18 NOTE — Patient Instructions (Addendum)
Access Code: [email or web address]  URL: [email or web address]  Date: 09/18/2018  Prepared by: Roxana Hires   Exercises Standing Gastroc Stretch on Step - 3 reps - 30-45 seconds hold - 1x daily - 7x weekly  Trigger Point Dry Needling  . What is Trigger Point Dry Needling (DN)? o DN is a physical therapy technique used to treat muscle pain and dysfunction. Specifically, DN helps deactivate muscle trigger points (muscle knots).  o A thin filiform needle is used to penetrate the skin and stimulate the underlying trigger point. The goal is for a local twitch response (LTR) to occur and for the trigger point to relax. No medication of any kind is injected during the procedure.   . What Does Trigger Point Dry Needling Feel Like?  o The procedure feels different for each individual patient. Some patients report that they do not actually feel the needle enter the skin and overall the process is not painful. Very mild bleeding may occur. However, many patients feel a deep cramping in the muscle in which the needle was inserted. This is the local twitch response.   Marland Kitchen How Will I feel after the treatment? o Soreness is normal, and the onset of soreness may not occur for a few hours. Typically this soreness does not last longer than two days.  o Bruising is uncommon, however; ice can be used to decrease any possible bruising.  o In rare cases feeling tired or nauseous after the treatment is normal. In addition, your symptoms may get worse before they get better, this period will typically not last longer than 24 hours.   . What Can I do After My Treatment? o Increase your hydration by drinking more water for the next 24 hours. o You may place ice or heat on the areas treated that have become sore, however, do not use heat on inflamed or bruised areas. Heat often brings more relief post needling. o You can continue your regular activities, but vigorous activity is not recommended initially after the  treatment for 24 hours. o DN is best combined with other physical therapy such as strengthening, stretching, and other therapies.

## 2018-09-20 ENCOUNTER — Ambulatory Visit: Payer: Medicare Other

## 2018-09-20 ENCOUNTER — Other Ambulatory Visit: Payer: Self-pay

## 2018-09-20 DIAGNOSIS — M6281 Muscle weakness (generalized): Secondary | ICD-10-CM | POA: Diagnosis not present

## 2018-09-20 DIAGNOSIS — M5441 Lumbago with sciatica, right side: Secondary | ICD-10-CM
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
## 2018-09-25 ENCOUNTER — Other Ambulatory Visit: Payer: Self-pay

## 2018-09-25 ENCOUNTER — Ambulatory Visit: Payer: Medicare Other

## 2018-09-25 DIAGNOSIS — M5441 Lumbago with sciatica, right side: Secondary | ICD-10-CM

## 2018-09-25 DIAGNOSIS — M6281 Muscle weakness (generalized): Secondary | ICD-10-CM
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
## 2018-09-27 ENCOUNTER — Other Ambulatory Visit: Payer: Self-pay

## 2018-09-27 ENCOUNTER — Ambulatory Visit: Payer: Medicare Other | Attending: Orthopedic Surgery

## 2018-09-27 DIAGNOSIS — M5441 Lumbago with sciatica, right side: Secondary | ICD-10-CM | POA: Diagnosis present

## 2018-09-27 DIAGNOSIS — M6281 Muscle weakness (generalized): Secondary | ICD-10-CM | POA: Insufficient documentation
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
## 2018-10-02 ENCOUNTER — Ambulatory Visit: Payer: Medicare Other

## 2018-10-02 ENCOUNTER — Other Ambulatory Visit: Payer: Self-pay

## 2018-10-02 DIAGNOSIS — M6281 Muscle weakness (generalized): Secondary | ICD-10-CM | POA: Diagnosis not present

## 2018-10-02 DIAGNOSIS — M5441 Lumbago with sciatica, right side: Secondary | ICD-10-CM
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
## 2018-10-04 ENCOUNTER — Ambulatory Visit: Payer: Medicare Other

## 2018-10-09 ENCOUNTER — Ambulatory Visit: Payer: Medicare Other

## 2018-10-11 ENCOUNTER — Ambulatory Visit: Payer: Medicare Other

## 2018-10-16 ENCOUNTER — Ambulatory Visit: Payer: Medicare Other

## 2018-10-18 ENCOUNTER — Ambulatory Visit: Payer: Medicare Other

## 2018-10-23 ENCOUNTER — Ambulatory Visit: Payer: Medicare Other

## 2018-10-25 ENCOUNTER — Ambulatory Visit: Payer: Medicare Other

## 2018-11-21 ENCOUNTER — Other Ambulatory Visit: Payer: Self-pay | Admitting: Neurology

## 2018-11-21 DIAGNOSIS — R413 Other amnesia: Secondary | ICD-10-CM

## 2018-11-29 ENCOUNTER — Ambulatory Visit: Payer: Medicare Other

## 2018-11-30 ENCOUNTER — Other Ambulatory Visit: Payer: Self-pay

## 2018-11-30 ENCOUNTER — Ambulatory Visit
Admission: RE | Admit: 2018-11-30 | Discharge: 2018-11-30 | Disposition: A | Discharge status: Home / Self Care | Payer: Medicare Other | Source: Ambulatory Visit | Attending: Neurology | Admitting: Neurology

## 2018-11-30 DIAGNOSIS — R413 Other amnesia: Secondary | ICD-10-CM | POA: Diagnosis present

## 2018-11-30 DIAGNOSIS — R9082 White matter disease, unspecified: Secondary | ICD-10-CM

## 2018-11-30 HISTORY — DX: White matter disease, unspecified: R90.82

## 2019-01-29 ENCOUNTER — Other Ambulatory Visit: Payer: Self-pay | Admitting: Internal Medicine

## 2019-01-29 DIAGNOSIS — Z1231 Encounter for screening mammogram for malignant neoplasm of breast: Secondary | ICD-10-CM

## 2019-03-05 ENCOUNTER — Ambulatory Visit
Admission: RE | Admit: 2019-03-05 | Discharge: 2019-03-05 | Disposition: A | Discharge status: Home / Self Care | Payer: Medicare Other | Source: Ambulatory Visit | Attending: Internal Medicine | Admitting: Internal Medicine

## 2019-03-05 DIAGNOSIS — Z1231 Encounter for screening mammogram for malignant neoplasm of breast: Secondary | ICD-10-CM | POA: Diagnosis not present

## 2019-10-08 ENCOUNTER — Other Ambulatory Visit: Payer: Self-pay

## 2019-10-08 ENCOUNTER — Ambulatory Visit
Admission: RE | Admit: 2019-10-08 | Discharge: 2019-10-08 | Disposition: A | Discharge status: Home / Self Care | Payer: Medicare Other | Source: Ambulatory Visit | Attending: Internal Medicine | Admitting: Internal Medicine

## 2019-10-08 ENCOUNTER — Other Ambulatory Visit (HOSPITAL_COMMUNITY): Payer: Self-pay | Admitting: Internal Medicine

## 2019-10-08 ENCOUNTER — Other Ambulatory Visit: Payer: Self-pay | Admitting: Internal Medicine

## 2019-10-08 DIAGNOSIS — M79604 Pain in right leg: Secondary | ICD-10-CM

## 2020-01-21 ENCOUNTER — Encounter: Payer: Self-pay | Admitting: Counselor

## 2020-01-21 ENCOUNTER — Ambulatory Visit (INDEPENDENT_AMBULATORY_CARE_PROVIDER_SITE_OTHER): Payer: Medicare Other | Admitting: Neurology

## 2020-01-21 ENCOUNTER — Encounter: Payer: Self-pay | Admitting: Neurology

## 2020-01-21 VITALS — BP 178/93 | HR 53 | Ht 67.0 in | Wt 222.0 lb

## 2020-01-21 DIAGNOSIS — F0391 Unspecified dementia with behavioral disturbance: Secondary | ICD-10-CM | POA: Diagnosis not present

## 2020-01-21 DIAGNOSIS — F688 Other specified disorders of adult personality and behavior: Secondary | ICD-10-CM

## 2020-01-21 DIAGNOSIS — R413 Other amnesia: Secondary | ICD-10-CM

## 2020-01-21 DIAGNOSIS — G309 Alzheimer's disease, unspecified: Secondary | ICD-10-CM | POA: Diagnosis not present

## 2020-01-21 DIAGNOSIS — Z818 Family history of other mental and behavioral disorders: Secondary | ICD-10-CM
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
## 2020-01-21 NOTE — Patient Instructions (Signed)
FDG PET Scan  Formal Memory Testing: [facility]born Heron Blood work MIND DIET [facility]  Recommendations to prevent or slow progression of cognitive decline:   Exercise You should increase exercise 30 to 45 minutes per day at least 3 days a week although 5 to 7 would be preferred. Any type of exercise (including walking) is acceptable although a recumbent bicycle may be best if you are unsteady. Disease related apathy can be a significant roadblock to exercise and the only way to overcome this is to make it a daily routine and perhaps have a reward at the end (something your loved one loves to eat or drink perhaps) or a personal trainer coming to the home can also be very useful. In general a structured, repetitive schedule is best.   Cardiovascular Health: You should optimize all cardiovascular risk factors (blood pressure, sugar, cholesterol) as vascular disease such as strokes and heart attacks can make memory problems much worse.   Diet: Eating a heart healthy (Mediterranean) diet is also a good idea; fish and poultry instead of red meat, nuts (mostly non-peanuts), vegetables, fruits, olive oil or canola oil (instead of butter), minimal salt (use other spices to flavor foods), whole grain rice, bread, cereal and pasta and wine in moderation.  General Health: Any diseases which effect your body will effect your brain such as a pneumonia, urinary infection, blood clot, heart attack or stroke. Keep contact with your primary care doctor for regular follow ups.  Sleep. A good nights sleep is healthy for the brain. Seven hours is recommended. If you have insomnia or poor sleep habits see the recommendations below  Tips: Structured and consistent daytime and nighttime routine, including regular wake times, bedtimes, and mealtimes, will be important for the patient to avoid confusion. Keeping frequently used items in designated places will help reduce stress from searching. If there are worries about  getting lost do not let the patient leave home unaccompanied. They might benefit from wearing an identification bracelet that will help others assist in finding home if they become lost. Information about nationwide safe return services and other helpful resources may be obtained through the Alzheimer's Association helpline at [phone].  Finances, Power of Restaurant manager, fast food Directives: You should consider putting legal safeguards in place with regard to financial and medical decision making. While the spouse always has power of attorney for medical and financial issues in the absence of any form, you should consider what you want in case the spouse / caregiver is no longer around or capable of making decisions.   [place] : MovieEvening.com.au.pdf  Or Google "[facility]" AND "An Proofreader for [employer]  Other States: PainBasics.com.au  The signature on these forms should be notarized.   DRIVING:   Driving only during the day Drive only to familiar Locations Avoid driving during bad weather  If you would like to be tested to see if you are driving safely, [facility] has a Clinical Driving Evaluation. To schedule an appointment call [phone].                RESOURCES:  Memory Loss: Improve your short term memory By Laverle Patter  The Alzheimer's Reading Room [email or web address]   The Alzheimer's Compendium http://www.alzcompend.info/  [employer] www.dukefamilysupport.CVE ([phone]  Recommended resources for caregivers (All can be purchased on [employer]):  1) A Caregiver's Guide to Dementia: Using Activities and Other Strategies to Prevent, Reduce and Manage Behavioral Symptoms by Mahala Menghini. Bethena Midget and [employer]  2) A Caregiver's Guide to Lewy Body Dementia by Briant Sites MS BSN and Velia Meyer   3) What If It's Not Alzheimer's?: A Caregiver's Guide to Dementia by Neila Gear (Author), Roberts Gaudy (Editor)  3) The 36 hour day by Rabins and Mace  4) Understanding Difficult Behaviors by Gerre Scull and White  Online course for helping caregivers reduce stress, guilt and frustration called the Caregivers Helpbook. The website is www.powerfultoolsforcaregivers.org  As a caregiver you are a Government social research officer. Problems you face as a caregiver are usually unique to your situation and the way your loved-one's disease manifests itself. The best way to use these books is to look at the Table of Contents and read any chapters of interest or that apply to challenges you are having as a caregiver.  NATIONAL RESOURCES: For more information on neurological disorders or research programs funded by the [employer] of Neurological Disorders and Stroke, contact the Institute's Gaffer (BRAIN) at: BRAIN [address] [place] [address] [phone] (toll-free) ToledoAutomobile.co.uk  Information on dementia is also available from the following organizations: Alzheimer's Disease Education and Referral (ADEAR) Center [employer] on Aging [address] [place] [address] [phone] (toll-free) CashCowGambling.be  Alzheimer's Association [address] [place], [place] [address] [phone] (toll-free, 24-hour helpline) [phone] (TDD) LimitLaws.hu  Alzheimer's Foundation of [place] [address] [place], [place] [address] [phone] (toll-free) www.alzfdn.org  Alzheimer's Drug Discovery Foundation [address], [address] [place], [place] [address] [phone] www.alzdiscovery.org  Association for Frontotemporal Degeneration [employer] [address] [place], [place] [address] [phone] (toll-free) www.theaftd.[facility]   [facility] [facility] [address] [place], [place] [address] [phone] (toll-free) www.brightfocus.org/alzheimers  [facility]ala [facility] Alzheimer's [facility] [address] [place],  [address] [phone] www.BushWebsites.nl  Lewy Body Dementia Association [address], [place], [place] [address] [phone] (toll-free LBD Caregiver Link) www.lbda.[facility] [address] [place], [place] [address] [phone] (toll-free) [phone] [facility]) http://www.maynard.net/  National Organization for Rare Disorders [address] [place], [place] [address] 5-465-681-EXNT [phone]) (toll-free) www.rarediseases.org  The Dementias: Hope Through Research was jointly produced by the [employer] of Neurological Disorders and Stroke (NINDS) and the [employer] on Aging (NIA), both part of the [employer], the [employer] research agency--supporting scientific studies that turn discovery into health. NINDS is the nation's leading funder of research on the brain and nervous system. The NINDS mission is to reduce the burden of neurological disease. For more information and resources, visit ToledoAutomobile.co.uk [1] or call 6151859641. NIA leads the federal government effort conducting and supporting research on aging and the health and well-being of older people. NIA's Alzheimer's Disease Education and Referral (ADEAR) Center offers information and publications on dementia and caregiving for families, caregivers, and professionals. For more information, visit CashCowGambling.be [2] or call [phone]. Also available from NIA are publications and information about Alzheimer's disease as well as the booklets Frontotemporal Disorders: Information for Patients, Families, and Caregivers and Lewy Body Dementia: Information for Patients, Families, and  Professionals. Source URL: VoiceZap.is

## 2020-01-25 LAB — B12 AND FOLATE PANEL
Folate: >20 ng/mL (ref >3.0)
Vitamin B-12: 828 pg/mL (ref 232–1245)

## 2020-01-25 LAB — COMPREHENSIVE METABOLIC PANEL
ALT: 23 IU/L (ref 0–32)
AST: 26 IU/L (ref 0–40)
Albumin/Globulin Ratio: 1.6 (ref 1.2–2.2)
Albumin: 4.2 g/dL (ref 3.6–4.6)
Alkaline Phosphatase: 111 IU/L (ref 44–121)
BUN/Creatinine Ratio: 19 (ref 12–28)
BUN: 15 mg/dL (ref 8–27)
Bilirubin Total: 0.5 mg/dL (ref 0.0–1.2)
CO2: 28 mmol/L (ref 20–29)
Calcium: 9.9 mg/dL (ref 8.7–10.3)
Chloride: 104 mmol/L (ref 96–106)
Creatinine, Ser: 0.79 mg/dL (ref 0.57–1.00)
GFR calc Af Amer: 79 mL/min/{1.73_m2} (ref >59)
GFR calc non Af Amer: 68 mL/min/{1.73_m2} (ref >59)
Globulin, Total: 2.6 g/dL (ref 1.5–4.5)
Glucose: 86 mg/dL (ref 65–99)
Potassium: 3.9 mmol/L (ref 3.5–5.2)
Sodium: 145 mmol/L — ABNORMAL HIGH (ref 134–144)
Total Protein: 6.8 g/dL (ref 6.0–8.5)

## 2020-01-25 LAB — CBC
Hematocrit: 40.6 % (ref 34.0–46.6)
Hemoglobin: 13.4 g/dL (ref 11.1–15.9)
MCH: 30.8 pg (ref 26.6–33.0)
MCHC: 33 g/dL (ref 31.5–35.7)
MCV: 93 fL (ref 79–97)
Platelets: 233 10*3/uL (ref 150–450)
RBC: 4.35 x10E6/uL (ref 3.77–5.28)
RDW: 14.3 % (ref 11.7–15.4)
WBC: 5.4 10*3/uL (ref 3.4–10.8)

## 2020-01-25 LAB — METHYLMALONIC ACID, SERUM: Methylmalonic Acid: 131 nmol/L (ref 0–378)

## 2020-01-25 LAB — RPR: RPR Ser Ql: NONREACTIVE

## 2020-01-25 LAB — VITAMIN B1: Thiamine: 123.8 nmol/L (ref 66.5–200.0)

## 2020-01-25 LAB — TSH: TSH: 1.29 u[IU]/mL (ref 0.450–4.500)

## 2020-01-25 LAB — HOMOCYSTEINE: Homocysteine: 11.8 umol/L (ref 0.0–21.3)

## 2020-01-28 DIAGNOSIS — I4891 Unspecified atrial fibrillation: Secondary | ICD-10-CM

## 2020-01-28 HISTORY — DX: Unspecified atrial fibrillation: I48.91

## 2020-02-08 ENCOUNTER — Other Ambulatory Visit: Payer: Self-pay | Admitting: Internal Medicine

## 2020-02-08 DIAGNOSIS — Z1231 Encounter for screening mammogram for malignant neoplasm of breast: Secondary | ICD-10-CM

## 2020-03-01 ENCOUNTER — Other Ambulatory Visit: Payer: Self-pay

## 2020-03-01 ENCOUNTER — Observation Stay
Admission: EM | Admit: 2020-03-01 | Discharge: 2020-03-03 | Disposition: A | Discharge status: Home / Self Care | Payer: Medicare Other | Attending: Internal Medicine | Admitting: Internal Medicine

## 2020-03-01 ENCOUNTER — Emergency Department: Payer: Medicare Other

## 2020-03-01 DIAGNOSIS — G629 Polyneuropathy, unspecified: Secondary | ICD-10-CM | POA: Diagnosis not present

## 2020-03-01 DIAGNOSIS — Z96643 Presence of artificial hip joint, bilateral: Secondary | ICD-10-CM | POA: Insufficient documentation

## 2020-03-01 DIAGNOSIS — R531 Weakness: Secondary | ICD-10-CM

## 2020-03-01 DIAGNOSIS — Z79899 Other long term (current) drug therapy: Secondary | ICD-10-CM | POA: Diagnosis not present

## 2020-03-01 DIAGNOSIS — Z20822 Contact with and (suspected) exposure to covid-19: Secondary | ICD-10-CM | POA: Diagnosis not present

## 2020-03-01 DIAGNOSIS — I4891 Unspecified atrial fibrillation: Secondary | ICD-10-CM | POA: Diagnosis present

## 2020-03-01 DIAGNOSIS — E669 Obesity, unspecified: Secondary | ICD-10-CM

## 2020-03-01 DIAGNOSIS — F0390 Unspecified dementia without behavioral disturbance: Secondary | ICD-10-CM | POA: Insufficient documentation

## 2020-03-01 DIAGNOSIS — M79606 Pain in leg, unspecified: Secondary | ICD-10-CM

## 2020-03-01 DIAGNOSIS — M79604 Pain in right leg: Secondary | ICD-10-CM

## 2020-03-01 DIAGNOSIS — R42 Dizziness and giddiness: Secondary | ICD-10-CM | POA: Diagnosis present

## 2020-03-01 DIAGNOSIS — I10 Essential (primary) hypertension: Secondary | ICD-10-CM | POA: Insufficient documentation

## 2020-03-01 DIAGNOSIS — I483 Typical atrial flutter: Secondary | ICD-10-CM | POA: Insufficient documentation

## 2020-03-01 DIAGNOSIS — Z7982 Long term (current) use of aspirin: Secondary | ICD-10-CM | POA: Insufficient documentation

## 2020-03-01 DIAGNOSIS — I4892 Unspecified atrial flutter: Secondary | ICD-10-CM

## 2020-03-01 DIAGNOSIS — H8110 Benign paroxysmal vertigo, unspecified ear: Secondary | ICD-10-CM | POA: Diagnosis not present

## 2020-03-01 DIAGNOSIS — M79605 Pain in left leg: Secondary | ICD-10-CM

## 2020-03-01 LAB — CBC
HCT: 40.1 % (ref 36.0–46.0)
Hemoglobin: 14.5 g/dL (ref 12.0–15.0)
MCH: 31.6 pg (ref 26.0–34.0)
MCHC: 36.2 g/dL — ABNORMAL HIGH (ref 30.0–36.0)
MCV: 87.4 fL (ref 80.0–100.0)
Platelets: 222 10*3/uL (ref 150–400)
RBC: 4.59 MIL/uL (ref 3.87–5.11)
RDW: 14.4 % (ref 11.5–15.5)
WBC: 7.4 10*3/uL (ref 4.0–10.5)
nRBC: 0 % (ref 0.0–0.2)

## 2020-03-01 LAB — BASIC METABOLIC PANEL
Anion gap: 10 (ref 5–15)
BUN: 17 mg/dL (ref 8–23)
CO2: 28 mmol/L (ref 22–32)
Calcium: 9.4 mg/dL (ref 8.9–10.3)
Chloride: 105 mmol/L (ref 98–111)
Creatinine, Ser: 0.83 mg/dL (ref 0.44–1.00)
GFR, Estimated: >60 mL/min (ref >60)
Glucose, Bld: 97 mg/dL (ref 70–99)
Potassium: 3.5 mmol/L (ref 3.5–5.1)
Sodium: 143 mmol/L (ref 135–145)

## 2020-03-01 LAB — COMPREHENSIVE METABOLIC PANEL
ALT: 26 U/L (ref 0–44)
AST: 33 U/L (ref 15–41)
Albumin: 4.1 g/dL (ref 3.5–5.0)
Alkaline Phosphatase: 82 U/L (ref 38–126)
Anion gap: 11 (ref 5–15)
BUN: 18 mg/dL (ref 8–23)
CO2: 28 mmol/L (ref 22–32)
Calcium: 9.8 mg/dL (ref 8.9–10.3)
Chloride: 103 mmol/L (ref 98–111)
Creatinine, Ser: 0.77 mg/dL (ref 0.44–1.00)
GFR, Estimated: >60 mL/min (ref >60)
Glucose, Bld: 106 mg/dL — ABNORMAL HIGH (ref 70–99)
Potassium: 3.7 mmol/L (ref 3.5–5.1)
Sodium: 142 mmol/L (ref 135–145)
Total Bilirubin: 1.2 mg/dL (ref 0.3–1.2)
Total Protein: 7.8 g/dL (ref 6.5–8.1)

## 2020-03-01 LAB — BRAIN NATRIURETIC PEPTIDE: B Natriuretic Peptide: 306.8 pg/mL — ABNORMAL HIGH (ref 0.0–100.0)

## 2020-03-01 LAB — RESP PANEL BY RT-PCR (FLU A&B, COVID) ARPGX2
Influenza A by PCR: NEGATIVE
Influenza B by PCR: NEGATIVE
SARS Coronavirus 2 by RT PCR: NEGATIVE

## 2020-03-01 LAB — MAGNESIUM: Magnesium: 2 mg/dL (ref 1.7–2.4)

## 2020-03-01 LAB — TSH: TSH: 1.449 u[IU]/mL (ref 0.350–4.500)

## 2020-03-01 LAB — TROPONIN I (HIGH SENSITIVITY)
Troponin I (High Sensitivity): 10 ng/L (ref <18)
Troponin I (High Sensitivity): 13 ng/L (ref <18)

## 2020-03-01 MED ORDER — LISINOPRIL 20 MG PO TABS
20.0000 mg | ORAL_TABLET | Freq: Every day | ORAL | Status: DC
  Administered 2020-03-02 – 2020-03-03 (×2): 20 mg via ORAL
  Filled 2020-03-01 (×2): qty 1

## 2020-03-01 MED ORDER — GABAPENTIN 100 MG PO CAPS
100.0000 mg | ORAL_CAPSULE | Freq: Two times a day (BID) | ORAL | Status: DC
  Administered 2020-03-01 – 2020-03-03 (×4): 100 mg via ORAL
  Filled 2020-03-01 (×4): qty 1

## 2020-03-01 MED ORDER — ACETAMINOPHEN 325 MG PO TABS: 325.0000 mg | ORAL_TABLET | ORAL | Status: DC | PRN

## 2020-03-01 MED ORDER — ADULT MULTIVITAMIN W/MINERALS CH
1.0000 | ORAL_TABLET | Freq: Every day | ORAL | Status: DC
  Administered 2020-03-01 – 2020-03-03 (×3): 1 via ORAL
  Filled 2020-03-01 (×3): qty 1

## 2020-03-01 MED ORDER — HYDROCHLOROTHIAZIDE 25 MG PO TABS
25.0000 mg | ORAL_TABLET | Freq: Every day | ORAL | Status: DC
  Administered 2020-03-02: 25 mg via ORAL
  Filled 2020-03-01: qty 1

## 2020-03-01 MED ORDER — OMEGA-3-ACID ETHYL ESTERS 1 G PO CAPS
1000.0000 mg | ORAL_CAPSULE | Freq: Every day | ORAL | Status: DC
  Administered 2020-03-01 – 2020-03-03 (×3): 1000 mg via ORAL
  Filled 2020-03-01 (×3): qty 1

## 2020-03-01 MED ORDER — ASPIRIN EC 81 MG PO TBEC: 81.0000 mg | DELAYED_RELEASE_TABLET | Freq: Every day | ORAL | Status: DC

## 2020-03-01 MED ORDER — ONDANSETRON HCL 4 MG/2ML IJ SOLN: 4.0000 mg | Freq: Four times a day (QID) | INTRAMUSCULAR | Status: DC | PRN

## 2020-03-01 MED ORDER — DONEPEZIL HCL 5 MG PO TABS
10.0000 mg | ORAL_TABLET | Freq: Every day | ORAL | Status: DC
  Administered 2020-03-01 – 2020-03-02 (×2): 10 mg via ORAL
  Filled 2020-03-01 (×3): qty 2

## 2020-03-01 MED ORDER — LISINOPRIL-HYDROCHLOROTHIAZIDE 20-25 MG PO TABS: 1.0000 | ORAL_TABLET | Freq: Every day | ORAL | Status: DC

## 2020-03-01 MED ORDER — ENOXAPARIN SODIUM 40 MG/0.4ML ~~LOC~~ SOLN: 40.0000 mg | SUBCUTANEOUS | Status: DC

## 2020-03-01 MED ORDER — APIXABAN 5 MG PO TABS
5.0000 mg | ORAL_TABLET | Freq: Two times a day (BID) | ORAL | Status: DC
  Administered 2020-03-01 – 2020-03-03 (×4): 5 mg via ORAL
  Filled 2020-03-01 (×4): qty 1

## 2020-03-01 MED ORDER — ATORVASTATIN CALCIUM 20 MG PO TABS
10.0000 mg | ORAL_TABLET | Freq: Every day | ORAL | Status: DC
  Administered 2020-03-02 – 2020-03-03 (×2): 10 mg via ORAL
  Filled 2020-03-01 (×2): qty 1

## 2020-03-01 MED ORDER — CALCIUM CARBONATE-VITAMIN D 500-200 MG-UNIT PO TABS
1.0000 | ORAL_TABLET | Freq: Every day | ORAL | Status: DC
  Administered 2020-03-02 – 2020-03-03 (×2): 1 via ORAL
  Filled 2020-03-01 (×2): qty 1
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
## 2020-03-01 NOTE — ED Triage Notes (Signed)
Patient had an episode of dizziness yesterday when getting out of the bed. States it lasted for a short time but was worried because she had never had this happen before. Called EMS and they "Looked in her ears" but told her they didn't know if was her heart or not. They went to [facility] this morning and they sent patient over for "cardiac evaluation."

## 2020-03-01 NOTE — ED Notes (Signed)
Pt to MRI

## 2020-03-01 NOTE — Progress Notes (Signed)
Pt has arrived to unit.  
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
## 2020-03-02 ENCOUNTER — Observation Stay (HOSPITAL_BASED_OUTPATIENT_CLINIC_OR_DEPARTMENT_OTHER)
Admit: 2020-03-02 | Discharge: 2020-03-02 | Disposition: A | Discharge status: Home / Self Care | Payer: Medicare Other | Attending: Internal Medicine | Admitting: Internal Medicine

## 2020-03-02 DIAGNOSIS — I4892 Unspecified atrial flutter: Secondary | ICD-10-CM

## 2020-03-02 DIAGNOSIS — E7800 Pure hypercholesterolemia, unspecified: Secondary | ICD-10-CM

## 2020-03-02 DIAGNOSIS — R42 Dizziness and giddiness: Secondary | ICD-10-CM

## 2020-03-02 DIAGNOSIS — I4891 Unspecified atrial fibrillation: Secondary | ICD-10-CM | POA: Diagnosis not present

## 2020-03-02 DIAGNOSIS — I10 Essential (primary) hypertension: Secondary | ICD-10-CM

## 2020-03-02 DIAGNOSIS — H8110 Benign paroxysmal vertigo, unspecified ear: Secondary | ICD-10-CM | POA: Diagnosis not present

## 2020-03-02 LAB — ECHOCARDIOGRAM COMPLETE
AR max vel: 2 cm2
AV Area VTI: 2.11 cm2
AV Area mean vel: 1.82 cm2
AV Mean grad: 2.5 mmHg
AV Peak grad: 4.7 mmHg
Ao pk vel: 1.08 m/s
Area-P 1/2: 2.66 cm2
Height: 67 in
S' Lateral: 2.38 cm
Weight: 3376 oz

## 2020-03-02 LAB — URINALYSIS, ROUTINE W REFLEX MICROSCOPIC
Bilirubin Urine: NEGATIVE
Glucose, UA: NEGATIVE mg/dL
Hgb urine dipstick: NEGATIVE
Ketones, ur: NEGATIVE mg/dL
Leukocytes,Ua: NEGATIVE
Nitrite: NEGATIVE
Protein, ur: NEGATIVE mg/dL
Specific Gravity, Urine: 1.003 — ABNORMAL LOW (ref 1.005–1.030)
pH: 7 (ref 5.0–8.0)

## 2020-03-02 NOTE — Progress Notes (Signed)
   03/02/20 1915  Clinical Encounter Type  Visited With Patient  Visit Type Initial;Spiritual support  Referral From Nurse   OR received for prayer. Upon arrival, the patient was sitting on the side of her bed. The patient was warm, welcoming, and grateful for the visit. The patient engaged in conversation during which the patient shared what brought her to the hospital, some of her familial history, and her love of baking. The patient also shared that she values giving to others, which is a value instilled in her by her own mother. She desires to give to others in ways that enable them to do for themselves rather than to be dependent. This chaplain provided support in the form of active and reflective listening, compassionate ministerial presence, and life-affirming humor.    Clovis Riley, Chaplain

## 2020-03-02 NOTE — Progress Notes (Addendum)
PROGRESS NOTE    Briana Howe  XQJ:[id number]  DOB: 03/08/1933  DOA: 03/01/2020 PCP: Lauro Regulus, MD Outpatient Specialists:   Hospital course:  86 year old female with PMH significant for HTN, PACs, HL, mild dementia and BPV was sent from PCP office yesterday due to tachycardia.  She had initially gone for evaluation of dizziness.  Work-up in the ED revealed new onset atrial fibrillation/flutter that was rate controlled at baseline.  Other work-up including head CT and brain MRI was negative for acute bleed or CVA.  Chest x-ray was negative.  EKG was without evidence of ischemia and troponin was 13.  Patient however was admitted for treatment of new onset atrial fibrillation/flutter and dizziness.   Subjective:  Patient seen with 2 attentive daughters at bedside.  She thinks she feels better but is not really sure.  She notes that she feels dizzy when she sits up in bed and has to steady herself before she can walk.  Yesterday however she was much more dizzy than usual and was dizzy with walking which is unusual for her.  Patient states she was able to walk to the bathroom today with assistance of nurse with minimal dizziness.  Patient denies any chest pain or shortness of breath.   Objective: Vitals:   03/01/20 2310 03/02/20 0411 03/02/20 0726 03/02/20 1557  BP: (!) 163/98 115/70 134/67 129/81  Pulse: 70 (!) 47 61 68  Resp: 17 17 16 18   Temp: 98.7 F (37.1 C) 97.8 F (36.6 C) 97.9 F (36.6 C) 98.7 F (37.1 C)  TempSrc: Oral Oral Oral   SpO2: 99% 98% 98% 99%  Weight:  95.7 kg    Height:       No intake or output data in the 24 hours ending 03/02/20 1608 Filed Weights   03/01/20 1020 03/02/20 0411  Weight: 100.7 kg 95.7 kg     Exam:  General: Patient appearing younger than stated age lying in bed at 20 degrees in no acute distress beaking with her daughters were at bedside. Eyes: sclera anicteric, conjuctiva mild injection bilaterally CVS:  S1-S2, irregular, no audible murmurs. Respiratory: Reasonable air entry bilaterally with few rales at the left base. GI: NABS, soft, NT, no guarding, no rebound tenderness. LE: No edema.  Neuro: A/O x 3, Moving all extremities equally with normal strength, CN 3-12 intact, grossly nonfocal.  Psych: patient is logical and coherent, judgement and insight appear normal, mood and affect appropriate to situation.   Assessment & Plan:   86 year old female with known BPV is admitted for worsened dizziness and what appears to be new onset A. fib flutter.  A. fib flutter  Patient is already rate controlled to baseline on no rate control medications As vas 2 score is 4, she was started on Eliquis 5 twice daily last night. Cardiogram is ordered Dr. 83 was consulted yesterday for cardiology evaluation today. TSH is within normal limits  Dizziness MRI was negative for stroke To my exam patient did have some dizziness with movement of head, I suspect some of this is at least her BPV.  She is already on meclizine which was continued. Would consider referring her for outpatient neurovestibular PT for her dizziness Very possibly that her increased dizziness was from her A. fib flutter although that is unclear. Also UA is never been sent, will order UA to complete work-up  HTN Continue lisinopril and HCTZ which were restarted yesterday  Dementia Very mild, continue donepezil  Neuropathy Gabapentin was continued  DVT prophylaxis: Eliquis Code Status: DNR Family Communication: Patient's 2 daughters were at bedside, discussed disease processes at length Disposition Plan:   Patient is from: Home  Anticipated Discharge Location: Likely home  Barriers to Discharge: Awaiting cardiology consultation  Is patient medically stable for Discharge: Yes   Consultants:  Cardiology  Procedures:  None  Antimicrobials:  None   Data Reviewed:  Basic Metabolic Panel: Recent Labs  Lab  03/01/20 1608 03/01/20 1908  NA 142 143  K 3.7 3.5  CL 103 105  CO2 28 28  GLUCOSE 106* 97  BUN 18 17  CREATININE 0.77 0.83  CALCIUM 9.8 9.4  MG  --  2.0   Liver Function Tests: Recent Labs  Lab 03/01/20 1608  AST 33  ALT 26  ALKPHOS 82  BILITOT 1.2  PROT 7.8  ALBUMIN 4.1   No results for input(s): LIPASE, AMYLASE in the last 168 hours. No results for input(s): AMMONIA in the last 168 hours. CBC: Recent Labs  Lab 03/01/20 1608  WBC 7.4  HGB 14.5  HCT 40.1  MCV 87.4  PLT 222   Cardiac Enzymes: No results for input(s): CKTOTAL, CKMB, CKMBINDEX, TROPONINI in the last 168 hours. BNP (last 3 results) No results for input(s): PROBNP in the last 8760 hours. CBG: No results for input(s): GLUCAP in the last 168 hours.  Recent Results (from the past 240 hour(s))  Resp Panel by RT-PCR (Flu A&B, Covid) Nasopharyngeal Swab     Status: None   Collection Time: 03/01/20  7:04 PM   Specimen: Nasopharyngeal Swab; Nasopharyngeal(NP) swabs in vial transport medium  Result Value Ref Range Status   SARS Coronavirus 2 by RT PCR NEGATIVE NEGATIVE Final    Comment: (NOTE) SARS-CoV-2 target nucleic acids are NOT DETECTED.  The SARS-CoV-2 RNA is generally detectable in upper respiratory specimens during the acute phase of infection. The lowest concentration of SARS-CoV-2 viral copies this assay can detect is 138 copies/mL. A negative result does not preclude SARS-Cov-2 infection and should not be used as the sole basis for treatment or other patient management decisions. A negative result may occur with  improper specimen collection/handling, submission of specimen other than nasopharyngeal swab, presence of viral mutation(s) within the areas targeted by this assay, and inadequate number of viral copies(<138 copies/mL). A negative result must be combined with clinical observations, patient history, and epidemiological information. The expected result is Negative.  Fact Sheet for  Patients:  BloggerCourse.com  Fact Sheet for Healthcare Providers:  SeriousBroker.it  This test is no t yet approved or cleared by the [place] FDA and  has been authorized for detection and/or diagnosis of SARS-CoV-2 by FDA under an Emergency Use Authorization (EUA). This EUA will remain  in effect (meaning this test can be used) for the duration of the COVID-19 declaration under Section 564(b)(1) of the Act, 21 U.S.C.section 360bbb-3(b)(1), unless the authorization is terminated  or revoked sooner.       Influenza A by PCR NEGATIVE NEGATIVE Final   Influenza B by PCR NEGATIVE NEGATIVE Final    Comment: (NOTE) The Xpert Xpress SARS-CoV-2/FLU/RSV plus assay is intended as an aid in the diagnosis of influenza from Nasopharyngeal swab specimens and should not be used as a sole basis for treatment. Nasal washings and aspirates are unacceptable for Xpert Xpress SARS-CoV-2/FLU/RSV testing.  Fact Sheet for Patients: BloggerCourse.com  Fact Sheet for Healthcare Providers: SeriousBroker.it  This test is not yet approved or cleared by the [place] FDA and has been  authorized for detection and/or diagnosis of SARS-CoV-2 by FDA under an Emergency Use Authorization (EUA). This EUA will remain in effect (meaning this test can be used) for the duration of the COVID-19 declaration under Section 564(b)(1) of the Act, 21 U.S.C. section 360bbb-3(b)(1), unless the authorization is terminated or revoked.  Performed at [facility], [address], [place], [place] [address]       Studies: CT Head Wo Contrast  Result Date: 03/01/2020 CLINICAL DATA:  Dizziness lung getting out of bed. Last in a short time. EXAM: CT HEAD WITHOUT CONTRAST TECHNIQUE: Contiguous axial images were obtained from the base of the skull through the vertex without intravenous contrast. COMPARISON:   12/24/2014 FINDINGS: Brain: No evidence of acute infarction, hemorrhage, extra-axial collection, ventriculomegaly, or mass effect. Generalized cerebral atrophy. Periventricular white matter low attenuation likely secondary to microangiopathy. Vascular: Cerebrovascular atherosclerotic calcifications are noted. Skull: Negative for fracture or focal lesion. Sinuses/Orbits: Visualized portions of the orbits are unremarkable. Visualized portions of the paranasal sinuses are unremarkable. Visualized portions of the mastoid air cells are unremarkable. Other: None. IMPRESSION: 1. No acute intracranial pathology. 2. Chronic microvascular disease and cerebral atrophy. Electronically Signed   By: Elige Ko   On: 03/01/2020 16:57   MR BRAIN WO CONTRAST  Result Date: 03/01/2020 CLINICAL DATA:  Neuro deficit, acute, stroke suspected. Additional provided: Patient reports dizziness beginning this morning. EXAM: MRI HEAD WITHOUT CONTRAST TECHNIQUE: Multiplanar, multiecho pulse sequences of the brain and surrounding structures were obtained without intravenous contrast. COMPARISON:  Head CT 03/01/2020.  Brain MRI 11/30/2018. FINDINGS: Brain: Mild generalized cerebral atrophy. Mild multifocal T2/FLAIR hyperintensity within the cerebral white matter is nonspecific, but compatible with chronic small vessel ischemic disease. There is no acute infarct. No evidence of intracranial mass. No chronic intracranial blood products. No extra-axial fluid collection. No midline shift. Partially empty sella turcica. Vascular: Expected proximal arterial flow voids. Skull and upper cervical spine: No focal marrow lesion. Cervical spondylosis. Sinuses/Orbits: Visualized orbits show no acute finding. Trace ethmoid and left frontal sinus mucosal thickening. IMPRESSION: No evidence of acute intracranial abnormality, including acute infarction. Mild cerebral atrophy and chronic small vessel ischemic disease, stable as compared to the brain MRI of  11/30/2018. Minimal paranasal sinus mucosal thickening. Electronically Signed   By: Jackey Loge DO   On: 03/01/2020 19:05   DG Chest Portable 1 View  Result Date: 03/01/2020 CLINICAL DATA:  Dizziness, new onset atrial fibrillation, hypertension EXAM: PORTABLE CHEST 1 VIEW COMPARISON:  None. FINDINGS: Single frontal view of the chest demonstrates an unremarkable cardiac silhouette. No airspace disease, effusion, or pneumothorax. Degenerative changes bilateral shoulders. IMPRESSION: 1. No acute intrathoracic process. Electronically Signed   By: Sharlet Salina M.D.   On: 03/01/2020 17:38   ECHOCARDIOGRAM COMPLETE  Result Date: 03/02/2020    ECHOCARDIOGRAM REPORT   Patient Name:   Briana Howe [facility] Date of Exam: 03/02/2020 Medical Rec #:  [id number]                  Height:       67.0 in Accession #:    [id number]                 Weight:       211.0 lb Date of Birth:  May 03, 1932                  BSA:          2.069 m Patient Age:    85 years  BP:           115/70 mmHg Patient Gender: F                          HR:           47 bpm. Exam Location:  [facility] Procedure: 2D Echo, Cardiac Doppler and Color Doppler Indications:     Atrial fibrillation 427.31  History:         Patient has no prior history of Echocardiogram examinations.                  Risk Factors:Hypertension and Dyslipidemia.  Sonographer:     Cristela Blue RDCS (AE) Referring Phys:  [id number] AMY N COX Diagnosing Phys: Debbe Odea MD IMPRESSIONS  1. Left ventricular ejection fraction, by estimation, is 55 to 60%. The left ventricle has normal function. The left ventricle has no regional wall motion abnormalities. Left ventricular diastolic function could not be evaluated.  2. Right ventricular systolic function is normal. The right ventricular size is normal.  3. Left atrial size was mildly dilated.  4. Right atrial size was mild to moderately dilated.  5. The mitral valve is normal in structure. Trivial mitral valve  regurgitation.  6. The aortic valve is tricuspid. Aortic valve regurgitation is not visualized. FINDINGS  Left Ventricle: Left ventricular ejection fraction, by estimation, is 55 to 60%. The left ventricle has normal function. The left ventricle has no regional wall motion abnormalities. The left ventricular internal cavity size was normal in size. There is  no left ventricular hypertrophy. Left ventricular diastolic function could not be evaluated. Right Ventricle: The right ventricular size is normal. No increase in right ventricular wall thickness. Right ventricular systolic function is normal. Left Atrium: Left atrial size was mildly dilated. Right Atrium: Right atrial size was mild to moderately dilated. Pericardium: There is no evidence of pericardial effusion. Mitral Valve: The mitral valve is normal in structure. Trivial mitral valve regurgitation. Tricuspid Valve: The tricuspid valve is grossly normal. Tricuspid valve regurgitation is not demonstrated. Aortic Valve: The aortic valve is tricuspid. Aortic valve regurgitation is not visualized. Aortic valve mean gradient measures 2.5 mmHg. Aortic valve peak gradient measures 4.7 mmHg. Aortic valve area, by VTI measures 2.11 cm. Pulmonic Valve: The pulmonic valve was not well visualized. Pulmonic valve regurgitation is not visualized. Aorta: The aortic root is normal in size and structure. IAS/Shunts: No atrial level shunt detected by color flow Doppler.  LEFT VENTRICLE PLAX 2D LVIDd:         3.59 cm LVIDs:         2.38 cm LV PW:         1.34 cm LV IVS:        1.10 cm LVOT diam:     2.00 cm LV SV:         52 LV SV Index:   25 LVOT Area:     3.14 cm  RIGHT VENTRICLE RV Basal diam:  3.61 cm RV S prime:     7.94 cm/s TAPSE (M-mode): 3.5 cm LEFT ATRIUM             Index       RIGHT ATRIUM           Index LA diam:        4.60 cm 2.22 cm/m  RA Area:     21.80 cm LA Vol (A2C):   67.3 ml 32.53 ml/m RA Volume:  70.70 ml  34.17 ml/m LA Vol (A4C):   61.4 ml 29.68  ml/m LA Biplane Vol: 63.9 ml 30.89 ml/m  AORTIC VALVE                   PULMONIC VALVE AV Area (Vmax):    2.00 cm    PV Vmax:        0.57 m/s AV Area (Vmean):   1.82 cm    PV Peak grad:   1.3 mmHg AV Area (VTI):     2.11 cm    RVOT Peak grad: 1 mmHg AV Vmax:           108.00 cm/s AV Vmean:          73.000 cm/s AV VTI:            0.244 m AV Peak Grad:      4.7 mmHg AV Mean Grad:      2.5 mmHg LVOT Vmax:         68.70 cm/s LVOT Vmean:        42.300 cm/s LVOT VTI:          0.164 m LVOT/AV VTI ratio: 0.67  AORTA Ao Root diam: 2.50 cm MITRAL VALVE               TRICUSPID VALVE MV Area (PHT): 2.66 cm    TR Peak grad:   35.8 mmHg MV Decel Time: 285 msec    TR Vmax:        299.00 cm/s MV E velocity: 99.90 cm/s                            SHUNTS                            Systemic VTI:  0.16 m                            Systemic Diam: 2.00 cm Debbe OdeaBrian Agbor-Etang MD Electronically signed by Debbe OdeaBrian Agbor-Etang MD Signature Date/Time: 03/02/2020/1:39:59 PM    Final      Scheduled Meds: . apixaban  5 mg Oral BID  . atorvastatin  10 mg Oral Daily  . calcium-vitamin D  1 tablet Oral Daily  . donepezil  10 mg Oral QHS  . gabapentin  100 mg Oral BID  . lisinopril  20 mg Oral Daily   And  . hydrochlorothiazide  25 mg Oral Daily  . multivitamin with minerals  1 tablet Oral Daily  . omega-3 acid ethyl esters  1,000 mg Oral Daily   Continuous Infusions:  Active Problems:   Afib (HCC)     Briana Howe, [facility]  If 7PM-7AM, please contact night-coverage [email or web address] Password [email or web address] 03/02/2020, 4:08 PM    LOS: 0 days

## 2020-03-02 NOTE — TOC Progression Note (Signed)
Transition of Care [facility]) - Progression Note    Patient Details  Name: Briana Howe MRN: [id number] Date of Birth: 1933/02/16  Transition of Care [facility]) CM/SW Contact  Bing Quarry, RN Phone Number: 03/02/2020, 5:35 PM  Clinical Narrative:  1740 Patient in OBS status and around 330 today reached out to provider who was waiting on cardiology input but felt patient would not be discharged today.   Checked again at 538 and waiting on cardiologist.   CM signing off for the day but patient is at 23 hour 14 min mark in OBS status, which was relayed to provider for information purposes.  Gabriel Cirri RN CM          Expected Discharge Plan and Services                                                 Social Determinants of Health (SDOH) Interventions    Readmission Risk Interventions No flowsheet data found.

## 2020-03-02 NOTE — Progress Notes (Signed)
*  PRELIMINARY RESULTS* Echocardiogram 2D Echocardiogram has been performed.  Briana Howe 03/02/2020, 10:25 AM
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
## 2020-03-03 DIAGNOSIS — H8110 Benign paroxysmal vertigo, unspecified ear: Secondary | ICD-10-CM | POA: Diagnosis not present

## 2020-03-03 DIAGNOSIS — I480 Paroxysmal atrial fibrillation: Secondary | ICD-10-CM

## 2020-03-03 DIAGNOSIS — I4892 Unspecified atrial flutter: Secondary | ICD-10-CM | POA: Diagnosis not present

## 2020-03-03 DIAGNOSIS — R42 Dizziness and giddiness: Secondary | ICD-10-CM | POA: Diagnosis not present

## 2020-03-03 DIAGNOSIS — I10 Essential (primary) hypertension: Secondary | ICD-10-CM | POA: Diagnosis not present

## 2020-03-03 LAB — CBC
HCT: 34.2 % — ABNORMAL LOW (ref 36.0–46.0)
Hemoglobin: 12.7 g/dL (ref 12.0–15.0)
MCH: 31.8 pg (ref 26.0–34.0)
MCHC: 37.1 g/dL — ABNORMAL HIGH (ref 30.0–36.0)
MCV: 85.5 fL (ref 80.0–100.0)
Platelets: 193 10*3/uL (ref 150–400)
RBC: 4 MIL/uL (ref 3.87–5.11)
RDW: 13.9 % (ref 11.5–15.5)
WBC: 5.8 10*3/uL (ref 4.0–10.5)
nRBC: 0 % (ref 0.0–0.2)

## 2020-03-03 LAB — BASIC METABOLIC PANEL
Anion gap: 8 (ref 5–15)
BUN: 15 mg/dL (ref 8–23)
CO2: 27 mmol/L (ref 22–32)
Calcium: 9.2 mg/dL (ref 8.9–10.3)
Chloride: 105 mmol/L (ref 98–111)
Creatinine, Ser: 0.9 mg/dL (ref 0.44–1.00)
GFR, Estimated: >60 mL/min (ref >60)
Glucose, Bld: 88 mg/dL (ref 70–99)
Potassium: 3.7 mmol/L (ref 3.5–5.1)
Sodium: 140 mmol/L (ref 135–145)

## 2020-03-03 MED ORDER — LISINOPRIL 20 MG PO TABS
20.0000 mg | ORAL_TABLET | Freq: Every day | ORAL | 0 refills | Status: DC
Start: 2020-03-04 — End: 2021-09-28

## 2020-03-03 MED ORDER — APIXABAN 5 MG PO TABS
5.0000 mg | ORAL_TABLET | Freq: Two times a day (BID) | ORAL | 0 refills | Status: DC
Start: 2020-03-03 — End: 2020-03-12

## 2020-03-03 NOTE — Discharge Instructions (Signed)
Atrial Flutter  Atrial flutter is a type of abnormal heart rhythm (arrhythmia). The heart has an electrical system that tells it how to beat. In atrial flutter, the signals move rapidly in the top chambers of the heart (the atria). This makes your heart beat very fast. Atrial flutter can come and go, or it can be permanent. The goal of treatment is to prevent blood clots from forming, control your heart rate, or restore your heartbeat to a normal rhythm. If this condition is not treated, it can cause serious problems, such as a weakened heart muscle (cardiomyopathy) or a stroke. What are the causes? This condition is often caused by conditions that damage the heart's electrical system. These include:  Heart conditions and heart surgery. These include heart attacks and open-heart surgery.  Lung problems, such as COPD or a blood clot in the lung (pulmonary embolism, or PE).  Poorly controlled high blood pressure (hypertension).  Overactive thyroid (hyperthyroidism).  Diabetes. In some cases, the cause of this condition is not known. What increases the risk? You are more likely to develop this condition if:  You are an elderly adult.  You are a man.  You are overweight (obese).  You have obstructive sleep apnea.  You have a family history of atrial flutter.  You have diabetes.  You drink a lot of alcohol, especially binge drinking.  You use drugs, including cannabis.  You smoke. What are the signs or symptoms? Symptoms of this condition include:  A feeling that your heart is pounding or racing (palpitations).  Shortness of breath.  Chest pain.  Feeling dizzy or light-headed.  Fainting.  Low blood pressure (hypotension).  Fatigue.  Tiring easily during exercise or activity. In some cases, there are no symptoms. How is this diagnosed? This condition may be diagnosed with:  An electrocardiogram (ECG) to check electrical signals of the heart.  An ambulatory  cardiac monitor to record your heart's activity for a few days.  An echocardiogram to create pictures of your heart.  A transesophageal echocardiogram (TEE) to create even better pictures of your heart.  A stress test to check your blood supply while you exercise.  Imaging tests, such as a CT scan or chest X-ray.  Blood tests. How is this treated? Treatment depends on underlying conditions and how you feel when you experience atrial flutter. This condition may be treated with:  Medicines to prevent blood clots or to treat heart rate or heart rhythm problems.  Electrical cardioversion to reset the heart's rhythm.  Ablation to remove the heart tissue that sends abnormal signals.  Left atrial appendage closure to seal the area where blood clots can form. In some cases, underlying conditions will be treated. Follow these instructions at home: Medicines  Take over-the-counter and prescription medicines only as told by your health care provider.  Do not take any new medicines without talking to your health care provider.  If you are taking blood thinners: ? Talk with your health care provider before you take any medicines that contain aspirin or NSAIDs, such as ibuprofen. These medicines increase your risk for dangerous bleeding. ? Take your medicine exactly as told, at the same time every day. ? Avoid activities that could cause injury or bruising, and follow instructions about how to prevent falls. ? Wear a medical alert bracelet or carry a card that lists what medicines you take. Lifestyle  Eat heart-healthy foods. Talk with a dietitian to make an eating plan that is right for you.  Do   not use any products that contain nicotine or tobacco, such as cigarettes, e-cigarettes, and chewing tobacco. If you need help quitting, ask your health care provider.  Do not drink alcohol.  Do not use drugs, including cannabis.  Lose weight if you are overweight or obese.  Exercise  regularly as instructed by your health care provider. General instructions  Do not use diet pills unless your health care provider approves. Diet pills may make heart problems worse.  If you have obstructive sleep apnea, manage your condition as told by your health care provider.  Keep all follow-up visits as told by your health care provider. This is important. Contact a health care provider if you:  Notice a change in the rate, rhythm, or strength of your heartbeat.  Are taking a blood thinner and you notice more bruising.  Have a sudden change in weight.  Tire more easily when you exercise or do heavy work. Get help right away if you have:  Pain or pressure in your chest.  Shortness of breath.  Fainting.  Increasing sweating with no known cause.  Side effects of blood thinners, such as blood in your vomit, stool, or urine, or bleeding that cannot stop.  Any symptoms of a stroke. "BE FAST" is an easy way to remember the main warning signs of a stroke: ? B - Balance. Signs are dizziness, sudden trouble walking, or loss of balance. ? E - Eyes. Signs are trouble seeing or a sudden change in vision. ? F - Face. Signs are sudden weakness or numbness of the face, or the face or eyelid drooping on one side. ? A - Arms. Signs are weakness or numbness in an arm. This happens suddenly and usually on one side of the body. ? S - Speech. Signs are sudden trouble speaking, slurred speech, or trouble understanding what people say. ? T - Time. Time to call emergency services. Write down what time symptoms started.  Other signs of a stroke, such as: ? A sudden, severe headache with no known cause. ? Nausea or vomiting. ? Seizure.  These symptoms may represent a serious problem that is an emergency. Do not wait to see if the symptoms will go away. Get medical help right away. Call your local emergency services (911 in the [place] Do not drive yourself to the hospital. Summary  Atrial  flutter is an abnormal heart rhythm that can give you symptoms of palpitations, shortness of breath, or fatigue.  Atrial flutter is often treated with medicines to keep your heart in a normal rhythm and to prevent a stroke.  Get help right away if you cannot catch your breath, or have chest pain or pressure.  Get help right away if you have signs or symptoms of a stroke. This information is not intended to replace advice given to you by your health care provider. Make sure you discuss any questions you have with your health care provider. Document Revised: 09/06/2018 Document Reviewed: 09/06/2018 Elsevier Patient Education  2020 [employer]  

## 2020-03-06 NOTE — Discharge Summary (Signed)
Physician Discharge Summary  Briana Howe ZOX:[id number] DOB: [birth year] DOA: 03/01/2020  PCP: Lauro Regulus, MD  Admit date: 03/01/2020 Discharge date: 03/06/2020  Recommendations for Outpatient Follow-up:  1. Discharge to home with family supervision.  2. Ambulation with walker and assistance. 3. Ambulatory referral to outpatient PT for neurovestibular rehab. 4. Follow up with PCP in 7-10 days. Draw chemistry at that visit. 5. Follow up with Cardiology in 2-4 weeks.   Follow-up Information    Lauro Regulus, MD.   Specialty: Internal Medicine Why: Patient will need to make a follow up appointment. Contact information: [address] [facility] [place] [address] [phone]        Antonieta Iba, MD On 03/11/2020.   Specialty: Cardiology Why: Dr. Mariah Milling is at this practice. Patient prefers to see him....@ 11am  Contact information: [address] [place] [address] [phone]              Discharge Diagnoses: Principal diagnosis is #1 1. Vertigo due to BPPV 2. Atrial fibrillation/flutter 3. Hypertension 4. Dementia 5. Neuropathy  Discharge Condition: Fair  Disposition: Home with family supervision  Diet recommendation: Heart healthy  Filed Weights   03/01/20 1020 03/02/20 0411 03/03/20 0612  Weight: 100.7 kg 95.7 kg 95.3 kg    History of present illness: Briana Howe is a 86 y.o. female with medical history significant for hypertension, history of atrial premature contractions, obesity, hyperlipidemia, neuropathy, mild dementia, presented to the emergency department for chief concerns of dizziness.  She reports the dizziness started about midnight on 02/29/20, and she went to sleep. She woke up around 6 AM and felt dizziness as well. She endorses associated frontal headache and weakness. She endorsed feeling nausea and no vomiting. She endorsed trying a tylenol at home and  it did help with the headache however it did not improve her dizziness.   She went shopping on Friday, 02/29/20 and during that time she did not feel dizzy.   She denies medication and diet changes. She denies recent travels and has not been a boat, ship, or cruise. She denies noxious ingestion including etoh. She states she never drinks etoh.   ROS was negative for fever, cough, vision changes, tinnitus, dysphagia, odynophagia, vomiting, chest pain, shortness of breath, dysuria, hematuria, blood in stool. She denies changed in baseline swelling in her bilateral lower extremities. She endorses lost of appetite. She endorses intentional 10 pound weight lost in 1 year through diet.   Social history: lives with her daughter, Briana Howe. She is a retired and formerly worked in a factory as a Lobbyist. She never used tobacco products, etoh, recreational drugs.   Hospital Course: The patient was admitted to a telemetry bed. Cardiology was consulted. The patient was started on anticoagulation. Her heart rate was controlled without AV nodal agents. Echocardiogram was obtained and demonstrate a preserved ejection fraction and mild left atrial dilatation. Her HCTZ was stopped as it can contribute to orthostatic hypotension and dizziness.   The patient was evaluated by PT/OT. They recommended SNF placement. However, the patient and her family declined this. The patient was cleared for discharge by cardiology. They recommended that the patient follow up with them as outpatient for cardioversion after she had been anticoagulated for at least 3 weeks. The patient was discharged to home with 24 hour supervision and a referral for outpatient PT for neurovestibular therapy.  Today's assessment: S: The patient is resting comfortably. No new  complaints. O: Vitals:  Vitals:   03/03/20 0612 03/03/20 0800  BP: (!) 142/87 114/64  Pulse: 63 70  Resp: 16 18  Temp: 98 F (36.7 C) 98 F (36.7 C)  SpO2: 97% 100%    Exam:  Constitutional:  . The patient is awake, alert, and oriented x 3. No acute distress. Respiratory:  . No increased work of breathing. . No wheezes, rales, or rhonchi . No tactile fremitus Cardiovascular:  . Regular rate and rhythm . No murmurs, ectopy, or gallups. . No lateral PMI. No thrills. Abdomen:  . Abdomen is soft, non-tender, non-distended . No hernias, masses, or organomegaly . Normoactive bowel sounds.  Musculoskeletal:  . No cyanosis, clubbing, or edema Skin:  . No rashes, lesions, ulcers . palpation of skin: no induration or nodules Neurologic:  . CN 2-12 intact . Sensation all 4 extremities intact Psychiatric:  . Mental status o Mood, affect appropriate o Orientation to person, place, time  . judgment and insight appear intact    Discharge Instructions  Discharge Instructions    Activity as tolerated - No restrictions   Complete by: As directed    Ambulatory referral to Physical Therapy   Complete by: As directed    Neuro-Vestibular rehab.   Ambulatory referral to Physical Therapy   Complete by: As directed    Call MD for:  persistant nausea and vomiting   Complete by: As directed    Call MD for:  severe uncontrolled pain   Complete by: As directed    Diet - low sodium heart healthy   Complete by: As directed    Discharge instructions   Complete by: As directed    Discharge to home with family supervision.  Ambulation with walker and assistance. Ambulatory referral to outpatient PT for neurovestibular rehab. Follow up with PCP in 7-10 days. Draw chemistry at that visit. Follow up with Cardiology in 2-4 weeks.   For home use only DME 4 wheeled rolling walker with seat   Complete by: As directed    Patient needs a walker to treat with the following condition:  Vertigo Ambulatory dysfunction     Increase activity slowly   Complete by: As directed      Allergies as of 03/03/2020      Reactions   Shellfish Allergy    Unable to walk       Medication List    STOP taking these medications   aspirin EC 81 MG tablet   lisinopril-hydrochlorothiazide 20-25 MG tablet Commonly known as: ZESTORETIC     TAKE these medications   apixaban 5 MG Tabs tablet Commonly known as: ELIQUIS Take 1 tablet (5 mg total) by mouth 2 (two) times daily. Notes to patient: Next dose due this evening   atorvastatin 10 MG tablet Commonly known as: LIPITOR Take 10 mg by mouth daily.   Calcium + D3 600-800 MG-UNIT Tabs Generic drug: Calcium Carb-Cholecalciferol Take 1 tablet by mouth daily.   donepezil 10 MG tablet Commonly known as: ARICEPT Take 10 mg by mouth at bedtime.   gabapentin 100 MG capsule Commonly known as: NEURONTIN Take 100 mg by mouth 2 (two) times daily. Notes to patient: Next dose due this evening   ketotifen 0.025 % ophthalmic solution Commonly known as: ZADITOR Place 1-2 drops into both eyes as directed.   lisinopril 20 MG tablet Commonly known as: ZESTRIL Take 1 tablet (20 mg total) by mouth daily.   Multi-Vitamins Tabs Take 1 tablet by mouth daily.   Omega-3  Fish Oil 1200 MG Caps Take 1,200 mg by mouth daily.   potassium chloride 8 MEQ tablet Commonly known as: KLOR-CON Take 16 mEq by mouth daily.            Durable Medical Equipment  (From admission, onward)         Start     Ordered   03/03/20 0000  For home use only DME 4 wheeled rolling walker with seat       Question Answer Comment  Patient needs a walker to treat with the following condition Vertigo   Patient needs a walker to treat with the following condition Ambulatory dysfunction      03/03/20 1259         Allergies  Allergen Reactions  . Shellfish Allergy     Unable to walk    The results of significant diagnostics from this hospitalization (including imaging, microbiology, ancillary and laboratory) are listed below for reference.    Significant Diagnostic Studies: CT Head Wo Contrast  Result Date:  03/01/2020 CLINICAL DATA:  Dizziness lung getting out of bed. Last in a short time. EXAM: CT HEAD WITHOUT CONTRAST TECHNIQUE: Contiguous axial images were obtained from the base of the skull through the vertex without intravenous contrast. COMPARISON:  12/24/2014 FINDINGS: Brain: No evidence of acute infarction, hemorrhage, extra-axial collection, ventriculomegaly, or mass effect. Generalized cerebral atrophy. Periventricular white matter low attenuation likely secondary to microangiopathy. Vascular: Cerebrovascular atherosclerotic calcifications are noted. Skull: Negative for fracture or focal lesion. Sinuses/Orbits: Visualized portions of the orbits are unremarkable. Visualized portions of the paranasal sinuses are unremarkable. Visualized portions of the mastoid air cells are unremarkable. Other: None. IMPRESSION: 1. No acute intracranial pathology. 2. Chronic microvascular disease and cerebral atrophy. Electronically Signed   By: Elige KoHetal  Patel   On: 03/01/2020 16:57   MR BRAIN WO CONTRAST  Result Date: 03/01/2020 CLINICAL DATA:  Neuro deficit, acute, stroke suspected. Additional provided: Patient reports dizziness beginning this morning. EXAM: MRI HEAD WITHOUT CONTRAST TECHNIQUE: Multiplanar, multiecho pulse sequences of the brain and surrounding structures were obtained without intravenous contrast. COMPARISON:  Head CT 03/01/2020.  Brain MRI 11/30/2018. FINDINGS: Brain: Mild generalized cerebral atrophy. Mild multifocal T2/FLAIR hyperintensity within the cerebral white matter is nonspecific, but compatible with chronic small vessel ischemic disease. There is no acute infarct. No evidence of intracranial mass. No chronic intracranial blood products. No extra-axial fluid collection. No midline shift. Partially empty sella turcica. Vascular: Expected proximal arterial flow voids. Skull and upper cervical spine: No focal marrow lesion. Cervical spondylosis. Sinuses/Orbits: Visualized orbits show no acute  finding. Trace ethmoid and left frontal sinus mucosal thickening. IMPRESSION: No evidence of acute intracranial abnormality, including acute infarction. Mild cerebral atrophy and chronic small vessel ischemic disease, stable as compared to the brain MRI of 11/30/2018. Minimal paranasal sinus mucosal thickening. Electronically Signed   By: Jackey LogeKyle  Golden DO   On: 03/01/2020 19:05   DG Chest Portable 1 View  Result Date: 03/01/2020 CLINICAL DATA:  Dizziness, new onset atrial fibrillation, hypertension EXAM: PORTABLE CHEST 1 VIEW COMPARISON:  None. FINDINGS: Single frontal view of the chest demonstrates an unremarkable cardiac silhouette. No airspace disease, effusion, or pneumothorax. Degenerative changes bilateral shoulders. IMPRESSION: 1. No acute intrathoracic process. Electronically Signed   By: Sharlet SalinaMichael  Brown M.D.   On: 03/01/2020 17:38   ECHOCARDIOGRAM COMPLETE  Result Date: 03/02/2020    ECHOCARDIOGRAM REPORT   [facility] Date of Exam: 03/02/2020 Medical Rec #:  [id number]  Height:       67.0 in Accession #:    [id number]                 Weight:       211.0 lb Date of Birth:  1932-11-18                  BSA:          2.069 m Patient Age:    87 years                   BP:           115/70 mmHg Patient Gender: F                          HR:           47 bpm. Exam Location:  [facility] Procedure: 2D Echo, Cardiac Doppler and Color Doppler Indications:     Atrial fibrillation 427.31  History:         Patient has no prior history of Echocardiogram examinations.                  Risk Factors:Hypertension and Dyslipidemia.  Sonographer:     Cristela Blue RDCS (AE) Referring Phys:  [id number] AMY N COX Diagnosing Phys: Debbe Odea MD IMPRESSIONS  1. Left ventricular ejection fraction, by estimation, is 55 to 60%. The left ventricle has normal function. The left ventricle has no regional wall motion abnormalities. Left ventricular diastolic function could not be evaluated.   2. Right ventricular systolic function is normal. The right ventricular size is normal.  3. Left atrial size was mildly dilated.  4. Right atrial size was mild to moderately dilated.  5. The mitral valve is normal in structure. Trivial mitral valve regurgitation.  6. The aortic valve is tricuspid. Aortic valve regurgitation is not visualized. FINDINGS  Left Ventricle: Left ventricular ejection fraction, by estimation, is 55 to 60%. The left ventricle has normal function. The left ventricle has no regional wall motion abnormalities. The left ventricular internal cavity size was normal in size. There is  no left ventricular hypertrophy. Left ventricular diastolic function could not be evaluated. Right Ventricle: The right ventricular size is normal. No increase in right ventricular wall thickness. Right ventricular systolic function is normal. Left Atrium: Left atrial size was mildly dilated. Right Atrium: Right atrial size was mild to moderately dilated. Pericardium: There is no evidence of pericardial effusion. Mitral Valve: The mitral valve is normal in structure. Trivial mitral valve regurgitation. Tricuspid Valve: The tricuspid valve is grossly normal. Tricuspid valve regurgitation is not demonstrated. Aortic Valve: The aortic valve is tricuspid. Aortic valve regurgitation is not visualized. Aortic valve mean gradient measures 2.5 mmHg. Aortic valve peak gradient measures 4.7 mmHg. Aortic valve area, by VTI measures 2.11 cm. Pulmonic Valve: The pulmonic valve was not well visualized. Pulmonic valve regurgitation is not visualized. Aorta: The aortic root is normal in size and structure. IAS/Shunts: No atrial level shunt detected by color flow Doppler.  LEFT VENTRICLE PLAX 2D LVIDd:         3.59 cm LVIDs:         2.38 cm LV PW:         1.34 cm LV IVS:        1.10 cm LVOT diam:     2.00 cm LV SV:         52 LV SV Index:  25 LVOT Area:     3.14 cm  RIGHT VENTRICLE RV Basal diam:  3.61 cm RV S prime:     7.94 cm/s  TAPSE (M-mode): 3.5 cm LEFT ATRIUM             Index       RIGHT ATRIUM           Index LA diam:        4.60 cm 2.22 cm/m  RA Area:     21.80 cm LA Vol (A2C):   67.3 ml 32.53 ml/m RA Volume:   70.70 ml  34.17 ml/m LA Vol (A4C):   61.4 ml 29.68 ml/m LA Biplane Vol: 63.9 ml 30.89 ml/m  AORTIC VALVE                   PULMONIC VALVE AV Area (Vmax):    2.00 cm    PV Vmax:        0.57 m/s AV Area (Vmean):   1.82 cm    PV Peak grad:   1.3 mmHg AV Area (VTI):     2.11 cm    RVOT Peak grad: 1 mmHg AV Vmax:           108.00 cm/s AV Vmean:          73.000 cm/s AV VTI:            0.244 m AV Peak Grad:      4.7 mmHg AV Mean Grad:      2.5 mmHg LVOT Vmax:         68.70 cm/s LVOT Vmean:        42.300 cm/s LVOT VTI:          0.164 m LVOT/AV VTI ratio: 0.67  AORTA Ao Root diam: 2.50 cm MITRAL VALVE               TRICUSPID VALVE MV Area (PHT): 2.66 cm    TR Peak grad:   35.8 mmHg MV Decel Time: 285 msec    TR Vmax:        299.00 cm/s MV E velocity: 99.90 cm/s                            SHUNTS                            Systemic VTI:  0.16 m                            Systemic Diam: 2.00 cm Debbe Odea MD Electronically signed by Debbe Odea MD Signature Date/Time: 03/02/2020/1:39:59 PM    Final     Microbiology: Recent Results (from the past 240 hour(s))  Resp Panel by RT-PCR (Flu A&B, Covid) Nasopharyngeal Swab     Status: None   Collection Time: 03/01/20  7:04 PM   Specimen: Nasopharyngeal Swab; Nasopharyngeal(NP) swabs in vial transport medium  Result Value Ref Range Status   SARS Coronavirus 2 by RT PCR NEGATIVE NEGATIVE Final    Comment: (NOTE) SARS-CoV-2 target nucleic acids are NOT DETECTED.  The SARS-CoV-2 RNA is generally detectable in upper respiratory specimens during the acute phase of infection. The lowest concentration of SARS-CoV-2 viral copies this assay can detect is 138 copies/mL. A negative result does not preclude SARS-Cov-2 infection and should not be used as the sole basis  for treatment or other patient management decisions. A negative result may occur with  improper specimen collection/handling, submission of specimen other than nasopharyngeal swab, presence of viral mutation(s) within the areas targeted by this assay, and inadequate number of viral copies(<138 copies/mL). A negative result must be combined with clinical observations, patient history, and epidemiological information. The expected result is Negative.  Fact Sheet for Patients:  BloggerCourse.com  Fact Sheet for Healthcare Providers:  SeriousBroker.it  This test is no t yet approved or cleared by the [place] FDA and  has been authorized for detection and/or diagnosis of SARS-CoV-2 by FDA under an Emergency Use Authorization (EUA). This EUA will remain  in effect (meaning this test can be used) for the duration of the COVID-19 declaration under Section 564(b)(1) of the Act, 21 U.S.C.section 360bbb-3(b)(1), unless the authorization is terminated  or revoked sooner.       Influenza A by PCR NEGATIVE NEGATIVE Final   Influenza B by PCR NEGATIVE NEGATIVE Final    Comment: (NOTE) The Xpert Xpress SARS-CoV-2/FLU/RSV plus assay is intended as an aid in the diagnosis of influenza from Nasopharyngeal swab specimens and should not be used as a sole basis for treatment. Nasal washings and aspirates are unacceptable for Xpert Xpress SARS-CoV-2/FLU/RSV testing.  Fact Sheet for Patients: BloggerCourse.com  Fact Sheet for Healthcare Providers: SeriousBroker.it  This test is not yet approved or cleared by the [place] FDA and has been authorized for detection and/or diagnosis of SARS-CoV-2 by FDA under an Emergency Use Authorization (EUA). This EUA will remain in effect (meaning this test can be used) for the duration of the COVID-19 declaration under Section 564(b)(1) of the Act, 21  U.S.C. section 360bbb-3(b)(1), unless the authorization is terminated or revoked.  Performed at [facility], [address] [place], [place] [address]      Labs: Basic Metabolic Panel: Recent Labs  Lab 03/01/20 1608 03/01/20 1908 03/03/20 0601  NA 142 143 140  K 3.7 3.5 3.7  CL 103 105 105  CO2 28 28 27   GLUCOSE 106* 97 88  BUN 18 17 15   CREATININE 0.77 0.83 0.90  CALCIUM 9.8 9.4 9.2  MG  --  2.0  --    Liver Function Tests: Recent Labs  Lab 03/01/20 1608  AST 33  ALT 26  ALKPHOS 82  BILITOT 1.2  PROT 7.8  ALBUMIN 4.1   No results for input(s): LIPASE, AMYLASE in the last 168 hours. No results for input(s): AMMONIA in the last 168 hours. CBC: Recent Labs  Lab 03/01/20 1608 03/03/20 0601  WBC 7.4 5.8  HGB 14.5 12.7  HCT 40.1 34.2*  MCV 87.4 85.5  PLT 222 193   Cardiac Enzymes: No results for input(s): CKTOTAL, CKMB, CKMBINDEX, TROPONINI in the last 168 hours. BNP: BNP (last 3 results) Recent Labs    03/01/20 1608  BNP 306.8*    ProBNP (last 3 results) No results for input(s): PROBNP in the last 8760 hours.  CBG: No results for input(s): GLUCAP in the last 168 hours.  Active Problems:   Afib (HCC)   New onset atrial flutter (HCC)   Dizziness   Time coordinating discharge: 38 minutes.  Signed:        Joeph Szatkowski, DO [facility]  03/06/2020, 5:33 PM
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
## 2020-03-11 ENCOUNTER — Ambulatory Visit (INDEPENDENT_AMBULATORY_CARE_PROVIDER_SITE_OTHER): Payer: Medicare Other | Admitting: Cardiovascular Disease

## 2020-03-11 ENCOUNTER — Encounter: Payer: Self-pay | Admitting: Cardiovascular Disease

## 2020-03-11 ENCOUNTER — Ambulatory Visit: Payer: Medicare Other | Admitting: Physician Assistant

## 2020-03-11 ENCOUNTER — Telehealth: Payer: Self-pay | Admitting: Cardiovascular Disease

## 2020-03-11 ENCOUNTER — Other Ambulatory Visit: Payer: Self-pay

## 2020-03-11 VITALS — BP 140/84 | HR 92 | Ht 67.0 in | Wt 217.0 lb

## 2020-03-11 DIAGNOSIS — I739 Peripheral vascular disease, unspecified: Secondary | ICD-10-CM

## 2020-03-11 DIAGNOSIS — I480 Paroxysmal atrial fibrillation: Secondary | ICD-10-CM | POA: Diagnosis not present

## 2020-03-11 DIAGNOSIS — E782 Mixed hyperlipidemia: Secondary | ICD-10-CM

## 2020-03-11 DIAGNOSIS — I10 Essential (primary) hypertension: Secondary | ICD-10-CM | POA: Diagnosis not present

## 2020-03-11 MED ORDER — FUROSEMIDE 20 MG PO TABS: 20.0000 mg | ORAL_TABLET | Freq: Every day | ORAL | 1 refills | Status: DC | PRN

## 2020-03-11 NOTE — Patient Instructions (Addendum)
Medication Instructions:  Please take your Lasix 20mg  sparingly as needed for leg swelling, abdominal distention/swelling, shortness of breath, or increase cough  If you need a refill on your cardiac medications before your next appointment, please call your pharmacy.    Lab work: No new labs needed   If you have labs (blood work) drawn today and your tests are completely normal, you will receive your results only by: MyChart Message (if you have MyChart) OR . A paper copy in the mail If you have any lab test that is abnormal or we need to change your treatment, we will call you to review the results.   Testing/Procedures: No new testing needed   Follow-Up: At [facility], you and your health needs are our priority.  As part of our continuing mission to provide you with exceptional heart care, we have created designated Provider Care Teams.  These Care Teams include your primary Cardiologist (physician) and Advanced Practice Providers (APPs -  Physician Assistants and Nurse Practitioners) who all work together to provide you with the care you need, when you need it.  . You will need a follow up appointment in 6 months  . Providers on your designated Care Team:   . [facility], NP . Nicolasa Ducking, PA-C . Eula Listen, PA-C  Any Other Special Instructions Will Be Listed Below (If Applicable).  COVID-19 Vaccine Information can be found at: Marisue Ivan For questions related to vaccine distribution or appointments, please email [email or web address] or call [phone].

## 2020-03-11 NOTE — Telephone Encounter (Signed)
Please review for refill. Thanks!  

## 2020-03-11 NOTE — Telephone Encounter (Signed)
°*  STAT* If patient is at the pharmacy, call can be transferred to refill team.   1. Which medications need to be refilled? (please list name of each medication and dose if known)     Eliquis 5 mg po BID   2. Which pharmacy/location (including street and city if local pharmacy) is medication to be sent to?   [facility] [address]   3. Do they need a 30 day or 90 day supply? 90

## 2020-03-12 ENCOUNTER — Ambulatory Visit: Payer: Medicare Other | Admitting: Psychology

## 2020-03-12 ENCOUNTER — Ambulatory Visit (INDEPENDENT_AMBULATORY_CARE_PROVIDER_SITE_OTHER): Payer: Medicare Other | Admitting: Counselor

## 2020-03-12 ENCOUNTER — Encounter: Payer: Self-pay | Admitting: Counselor

## 2020-03-12 DIAGNOSIS — R4189 Other symptoms and signs involving cognitive functions and awareness: Secondary | ICD-10-CM

## 2020-03-12 DIAGNOSIS — R413 Other amnesia: Secondary | ICD-10-CM | POA: Diagnosis not present

## 2020-03-12 DIAGNOSIS — F09 Unspecified mental disorder due to known physiological condition: Secondary | ICD-10-CM

## 2020-03-12 MED ORDER — APIXABAN 5 MG PO TABS
5.0000 mg | ORAL_TABLET | Freq: Two times a day (BID) | ORAL | 1 refills | Status: DC
Start: 2020-03-12 — End: 2020-10-14

## 2020-03-12 NOTE — Progress Notes (Signed)
° °  Psychometrist Note   Cognitive testing was administered to Briana Howe by Briana Howe, B.S. (Technician) under the supervision of Briana Howe, Psy.D., ABN. Briana Howe was able to tolerate all test procedures. Dr. Nicole Howe met with the patient as needed to manage any emotional reactions to the testing procedures. Rest breaks were offered.    The battery of tests administered was selected by Dr. Nicole Howe with consideration to the patient's current level of functioning, the nature of her symptoms, emotional and behavioral responses during the interview, level of literacy, observed level of motivation/effort, and the nature of the referral question. This battery was communicated to the psychometrist. Communication between Dr. Nicole Howe and the psychometrist was ongoing throughout the evaluation and Dr. Nicole Howe was immediately accessible at all times. Dr. Nicole Howe provided supervision to the technician on the date of this service, to the extent necessary to assure the quality of all services provided.    Briana Howe will return in approximately one week for an interactive feedback session with Dr. Nicole Howe, at which time test performance, clinical impressions, and treatment recommendations will be reviewed in detail. The patient understands she can contact our office should she require our assistance before this time.   A total of 90 minutes of billable time were spent with Briana Howe by the technician, including test administration and scoring time. Billing for these services is reflected in Briana Howe note.   This note reflects time spent with the psychometrician and does not include test scores, clinical history, or any interpretations made by Dr. Nicole Howe. The full report will follow in a separate note.

## 2020-03-12 NOTE — Telephone Encounter (Signed)
Pt's age 86, wt 98.4 kg, SCr 0.9, CrCl 70.99, last ov w/ TG 03/11/20. Refill sent in as requested.
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
## 2020-03-13 ENCOUNTER — Telehealth: Payer: Self-pay | Admitting: Cardiovascular Disease

## 2020-03-13 NOTE — Telephone Encounter (Signed)
Patient daughter asks if her mom(patient) is a good candidate for cardiac reahab. Please call to discuss.

## 2020-03-13 NOTE — Progress Notes (Signed)
NEUROPSYCHOLOGICAL TEST SCORES [facility]  Patient Name: Briana Howe MRN: [id number] Date of Birth: 01/19/1933 Age: 86 y.o. Education: 14 years  Measurement properties of test scores: IQ, Index, and Standard Scores (SS): Mean = 100; Standard Deviation = 15 Scaled Scores (Ss): Mean = 10; Standard Deviation = 3 Z scores (Z): Mean = 0; Standard Deviation = 1 T scores (T); Mean = 50; Standard Deviation = 10  TEST SCORES:    Note: This summary of test scores accompanies the interpretive report and should not be interpreted by unqualified individuals or in isolation without reference to the report. Test scores are relative to age, gender, and educational history as available and appropriate.   Performance Validity        "A" Random Letter Test Raw  Descriptor      Errors 6 Below Expectation  The Dot Counting Test: 14 Within Expectation      Mental Status Screening     Total Score Descriptor  MMSE 18 Moderate Dementia      Expected Functioning        Wide Range Achievement Test: Standard/Scaled Score Percentile      Word Reading 93 32      Attention/Processing Speed        Neuropsychological Assessment Battery (Attention Module, Form 1): T-score Percentile      Digits Forward 42 21      Digits Backwards 31 3      Repeatable Battery for the Assessment of Neuropsychological Status (Form A): Scaled Score Percentile      Coding 2 <1      Language        Neuropsychological Assessment Battery (Language Module, Form 1): T-score Percentile      Naming   (16) 19 <1      Verbal Fluency: T-score Percentile      Controlled Oral Word Association (F-A-S) 37 9      Semantic Fluency (Animals) 50 50      Memory:        Neuropsychological Assessment Battery (Memory Module, Form 1): T-score Percentile      List Learning           List A Immediate Recall   (2, 4, 3) 19 <1         List B Immediate Recall   (3) 45 31         List A Short Delayed Recall   (3) 35 7          List A Long Delayed Recall   (0) 29 2         List A Percent Retention   (0 %) --- 2         List A Long Delayed Yes/No Recognition Hits   (10) --- 31         List A Long Delayed Yes/No Recognition False Alarms   (13) --- 3         List A Recognition Discriminability Index --- 1     Story Learning           Immediate Recall   (8, 14) 21 <1         Delayed Recall   (12) 30 2         Percent Retention   (86 %) --- 54      Daily Living Memory            Immediate Recall   (13, 9) 29 2  Delayed Recall   (0, 2) 19 <1          Percent Retention (22 %) --- <1          Recognition Hits    (6) --- 8      Repeatable Battery for the Assessment of Neuropsychological Status (Form A): Scaled Score Percentile         Figure Recall   (3) 4 2      Visuospatial/Constructional Functioning        Repeatable Battery for the Assessment of Neuropsychological Status (Form A): Standard/Scaled Score Percentile     Visuospatial/Constructional Index 78 7         Figure Copy   (18) 11 63         Judgment of Line Orientation   (6) --- <2      Executive Functioning        Trail Making Test: T-Score Percentile      Part A 33 5      Part B DC DC      [place] Diagnostic Aphasia Exam: Raw Score Scaled Score      Complex Ideational Material 9 5      Clock Drawing Raw Score Descriptor      Command 5 Moderate Impairment      Rating Scales        Clinical Dementia Rating Raw Score Descriptor      Sum of Boxes 5.0 Mild Dementia      Global Score 1.0 Mild Dementia      Quick Dementia Rating System Raw Score Descriptor      Sum of Boxes 2 MCI      Total Score 4.5 MCI  Geriatric Depression Scale - Short Form 1 Negative    Erby Sanderson V. Roseanne Reno PsyD, ABN Clinical Neuropsychologist

## 2020-03-13 NOTE — Telephone Encounter (Signed)
Routing to Dr Gollan to advise.  

## 2020-03-17 NOTE — Telephone Encounter (Signed)
I am not sure if she would qualify. Normal EF, not having heart failure, no recent blockage

## 2020-03-17 NOTE — Telephone Encounter (Signed)
Attempted to call pt or her daughter [name] back (DPR approved) about daughter's questions of if pt is a candidate for cardiac rehab. Was able to leave a detail message as per her DPR,  that Dr. Mariah Milling had advised   "Inot sure if she would qualify. Normal EF, not having heart failure, no recent blockage"  For any additional concerns pt or [name] should call back for anything further.

## 2020-03-19 ENCOUNTER — Encounter: Payer: Medicare Other | Admitting: Counselor

## 2020-03-20 ENCOUNTER — Telehealth: Payer: Self-pay | Admitting: Neurology

## 2020-03-20 ENCOUNTER — Ambulatory Visit
Admission: RE | Admit: 2020-03-20 | Discharge: 2020-03-20 | Disposition: A | Discharge status: Home / Self Care | Payer: Medicare Other | Source: Ambulatory Visit | Attending: Internal Medicine | Admitting: Internal Medicine

## 2020-03-20 ENCOUNTER — Other Ambulatory Visit: Payer: Self-pay

## 2020-03-20 DIAGNOSIS — Z1231 Encounter for screening mammogram for malignant neoplasm of breast: Secondary | ICD-10-CM

## 2020-03-20 NOTE — Telephone Encounter (Signed)
Bethany or on-call nurse: Would you call family and ask why the PET Scan has not been completed please? Dr. Clayborn Heron thinks it would be very helpful.  Cc: Darreld Mclean to ensure it was approved  thanks

## 2020-03-20 NOTE — Progress Notes (Signed)
NEUROPSYCHOLOGICAL EVALUATION [facility]  Patient Name: Briana ApplebaumMargaret Stanfield Howe MRN: [id number] Date of Birth: [birth year] Age: 86 y.o. Education: 14 years  Clinical Impressions  Briana ApplebaumMargaret Stanfield Howe is a 86 y.o., right-hand dominant, widowed (husband passed a year ago) woman with a history of memory and thinking problems over the past year who was referred by my neurology colleague Dr. Lucia GaskinsAhern. She is a very independent woman and gets agitated when her memory problems are discussed. The patient herself does not think much is wrong. I was able to talk to her daughter, who indicated that she forgets things, repeats herself, and rapidly forgets information. She also has a hard time with word finding. In terms of personality changes, they described her recent behaviors more as an exacerbation of premorbid personality than as frank changes and they deny any florid changes suspicious for FTD. She has an MRI that shows minimal leukoaraiosis, with slight posterior predominance and subtle impression of a bit more volume loss in the mesial temporal and parietal areas than elsewhere, but that is a soft call.   On neuropsychological testing, there is suggestion of fairly significant cognitive impairment showing on memory measures, visual object confrontation naming, and in several other areas (e.g., working memory, executive abilities, processing speed). Simple auditory attention and visuospatial constructional abilities presented as areas of relative preservation. Her memory problems likely represent a developing storage impairment, although she was able to retain a bit of information across time in some cases, suggesting a bit of residual capacity. Her daughter rated her as just at an MCI level of function but I would place her CDR Sum of Boxes at 5 and she is requiring help with some iADLs, thus meeting clinical criteria for dementia.   Briana Howe is thus demonstrating an amnestic dementia  syndrome currently at a mild level of progression. Her presentation is most consistent with Alzheimer's clinical syndrome, in the setting of some premorbid tendencies towards irascibility, aggravated by her anosognosia. I would put FTD much lower on the differential although she is pending a metabolic PET scan, which may also be helpful for confirmation. There may be a minor contributor from her leukoaraiosis but I concur with Dr. Lucia GaskinsAhern that the burden is insufficient to explain her dementia level problem and it also doesn't go well with her test data. She is already on a good dose of Aricept. Her family may benefit from a referral to Alzheimer's Association for support, which I will discuss. She may be at risk for impaired driving although there are no test findings implicating her as clearly unsafe to drive, recommend close monitoring over time.   Diagnostic Impressions: Alzheimer's dementia, late onset, with behavioral disturbance Anosognosia  Recommendations to be discussed with patient  Your performance and presentation was consistent with difficulties in several areas, including on measures of memory, word retrieval, and in other areas of testing. Given that you are having some problems with more complex activities of daily living (e.g., cooking) and get help with finances, I believe that you have reached a dementia level of function.   Dementia refers to a group of syndromes where multiple areas of ability are damaged in the brain, such as memory, thinking, judgment, and behavior, and most commonly refers to age related causes of dementia that cause worsening in these abilities over time. Alzheimer's disease is the most common form of dementia in people over the age of 84. Not all dementias are Alzheimer's disease, but all Alzheimer's disease is dementia. When dementia is due  to an underlying condition affecting the brain, such as Alzheimer's disease, there is progression over time, which typically  proceeds gradually over many years.   While definitive diagnosis of dementia is not possible in life, I have a very high level of confidence that your difficulties are most likely due to Alzheimer's disease, which is overwhelmingly the most common cause of cognitive decline in patients your age, goes with your imaging, your test data, and your clinical history. It is possible that there is a more minor contributor from the vascular changes identified on your MRI but those are an unlikely sole cause and do not well explain your problem. This means that there will be progression over time.   The mainstay of treatment in dementia is lifestyle changes and providing support with activities that have become too challenging. In that vein, I would recommend that your family provide oversight of your medication administration, financial management, and other cognitively complex/memory dependent activities.   You are already on a good front-line medication for dementia.   There is now good quality evidence from at least one large scale study that a modified mediterranean diet may help slow cognitive decline. This is known as the "MIND" diet. The Mind diet is not so much a specific diet as it is a set of recommendations for things that you should and should not eat.   The Alzheimer's association is a Geographical information systems officer for individuals with Alzheimer's disease and related disorders (ADRD). They offer a number of different services including support groups, a 24/7 crisis line, useful information, and activities such as their walk to end Alzheimer's to raise awareness about the disease. Many people benefit greatly from interacting with, learning from, and sharing information with others who are going through the same experience. Their website LimitLaws.hu is a great place to start. If you are interested, I can make a referral for a social worker from the Alzheimer's association to contact you.  Foods that  are ENCOURAGED on the MIND Diet:  Green, leafy vegetables: Aim for six or more servings per week. This includes kale, spinach, cooked greens and salads.  All other vegetables: Try to eat another vegetable in addition to the green leafy vegetables at least once a day. It is best to choose non-starchy vegetables because they have a lot of nutrients with a low number of calories.  Berries: Eat berries at least twice a week. There is a plethora of research on strawberries, and other berries such as blueberries, raspberries and blackberries have also been found to have antioxidant and brain health benefits.  Nuts: Try to get five servings of nuts or more each week. The creators of the MIND diet don't specify what kind of nuts to consume, but it is probably best to vary the type of nuts you eat to obtain a variety of nutrients. Peanuts are a legume and do not fall into this category.  Olive oil: Use olive oil as your main cooking oil. There may be other heart-healthy alternatives such as algae oil, though there is not yet sufficient research upon which to base a formal recommendation.  Whole grains: Aim for at least three servings daily. Choose minimally processed grains like oatmeal, quinoa, brown rice, whole-wheat pasta and 100% whole-wheat bread.  Fish: Eat fish at least once a week. It is best to choose fatty fish like salmon, sardines, trout, tuna and mackerel for their high amounts of omega-3 fatty acids.  Beans: Include beans in at least four meals every  week. This includes all beans, lentils and soybeans.  Poultry: Try to eat chicken or [place] at least twice a week. Note that fried chicken is not encouraged on the MIND diet.  Wine: Aim for no more than one glass of alcohol daily. Both red and white wine may benefit the brain. However, much research has focused on the red wine compound resveratrol, which may help protect against Alzheimer's disease.  Foods that are DISCOURAGED on the MIND Diet: Butter  and margarine: Try to eat less than 1 tablespoon (about 14 grams) daily. Instead, try using olive oil as your primary cooking fat, and dipping your bread in olive oil with herbs.  Cheese: The MIND diet recommends limiting your cheese consumption to less than once per week.  Red meat: Aim for no more than three servings each week. This includes all beef, pork, lamb and products made from these meats.  [place] food: The MIND diet highly discourages fried food, especially the kind from fast-food restaurants. Limit your consumption to less than once per week.  Pastries and sweets: This includes most of the processed junk food and desserts you can think of. Ice cream, cookies, brownies, snack cakes, donuts, candy and more. Try to limit these to no more than four times a week.  Exercise is one of the best medicines for promoting health and maintaining cognitive fitness at all stages in life. Exercise probably has the largest documented effect on brain health and performance of any lifestyle intervention. Studies have shown that even previously sedentary individuals who start exercising as late as age 51 show a significant survival benefit as compared to their non-exercising peers. In the [place], the current guidelines are for 30 minutes of moderate exercise per day, but increasing your activity level less than that may also be helpful. You do not have to get your 30 minutes of exercise in one shot and exercising for short periods of time spread throughout the day can be helpful. Go for several walks, learn to dance, or do something else you enjoy that gets your body moving. Of course, if you have an underlying medical condition or there is any question about whether it is safe for you to exercise, you should consult a medical treatment provider prior to beginning exercise.   Test Findings  Test scores are summarized in additional documentation associated with this encounter. Test scores are relative to age,  gender, and educational history as available and appropriate. There were no concerns about performance validity despite one anomalous indicator. My sense is that this likely reflects the extent of her cognitive problems as opposed to a performance validity problem per se.   General Intellectual Functioning/Achievement:  Briana Howe scored in the average range on single word reading, which presents as a reasonable standard of comparison for her cognitive test data.   Attention and Processing Efficiency: Performance on indicators of attention was mixed with reasonable low average range performance on digit repetition forward but unusually low performance on digit repetition backward and she also had difficulties with Serial 7's and spelling the word "World" backward, indicative of working memory problems.   Indicators of processing speed generated low scores with extremely low timed number-symbol coding and unusually low timed numeric sequencing.   Language: Performance on language measures was notable for markedly impaired visual object confrontation naming. She demonstrated weak unusually low phonemic fluency and average range semantic fluency.   Visuospatial Function: Indicators of visuospatial and constructional function fell at an unusually low level on  the relevant RBANS index, although some of that may be due to confusion regarding test instructions on judgment of angular line orientations, which generated an extremely low score. She scored in the average range on figure copy.   Learning and Memory: Measures of memory and learning showed likely storage problems, albeit with some retention of information over time suggesting a bit of residual memory capacity.   In the verbal realm, she learned 2, 4, and 3 words of a 12-item list across three trials, which is an extremely low score and a flat learning curve. Short delayed recall was unusually low and long delayed recall was extremely low.  Recognition cuing did not benefit her performance with more false positive errors than correct identifications. Memory for a short story was extremely low on immediate recall but she retained this information better over time and achieved an unusually low delayed recall score. Daily living memory was extremely low with impaired retention of information over time. Recognition discrimination for the information was unusually low.   In the visual realm, delayed recall for a modestly complex geometric figure was extremely low.   Executive Functions: Executive functions could only be sparsely tested because the patient had such difficulties with some measures that they could not be administered related to generalized cognitive impairment. Generation of words in response to the letters F-A-S was weak (low average almost unusually low). Reasoning with complex verbal information was unusually low. Clock drawing was suggestive of "moderate impairment" with intact face but errors in numbering and one poorly formed hand.   Rating Scale(s): Briana Howe was rated as functioning at an MCI level by her daughter. Her CDR Sum of Boxes is a 5, her global score is 1, and her presentation is consistent with mild dementia. Some of her difficulties may be being covered over because she lives in a family setting and gets help with many things.   Bettye Boeck Roseanne Reno PsyD, ABN Clinical Neuropsychologist

## 2020-04-07 ENCOUNTER — Ambulatory Visit: Payer: Medicare Other | Admitting: Cardiovascular Disease

## 2020-04-10 ENCOUNTER — Encounter: Payer: Self-pay | Admitting: Counselor

## 2020-04-10 ENCOUNTER — Ambulatory Visit (INDEPENDENT_AMBULATORY_CARE_PROVIDER_SITE_OTHER): Payer: Medicare Other | Admitting: Counselor

## 2020-04-10 ENCOUNTER — Other Ambulatory Visit: Payer: Self-pay

## 2020-04-10 DIAGNOSIS — F0281 Dementia in other diseases classified elsewhere with behavioral disturbance: Secondary | ICD-10-CM | POA: Diagnosis not present

## 2020-04-10 DIAGNOSIS — G301 Alzheimer's disease with late onset: Secondary | ICD-10-CM

## 2020-04-10 DIAGNOSIS — F02818 Dementia in other diseases classified elsewhere, unspecified severity, with other behavioral disturbance: Secondary | ICD-10-CM

## 2020-04-10 NOTE — Patient Instructions (Signed)
Your performance and presentation was consistent with difficulties in several areas, including on measures of memory, word retrieval, and in other areas of testing. Given that you are having some problems with more complex activities of daily living (e.g., cooking) and get help with finances, I believe that you have reached a dementia level of function.   Dementia refers to a group of syndromes where multiple areas of ability are damaged in the brain, such as memory, thinking, judgment, and behavior, and most commonly refers to age related causes of dementia that cause worsening in these abilities over time. Alzheimer's disease is the most common form of dementia in people over the age of 39. Not all dementias are Alzheimer's disease, but all Alzheimer's disease is dementia. When dementia is due to an underlying condition affecting the brain, such as Alzheimer's disease, there is progression over time, which typically proceeds gradually over many years.   While definitive diagnosis of dementia is not possible in life, I have a very high level of confidence that your difficulties are most likely due to Alzheimer's disease, which is overwhelmingly the most common cause of cognitive decline in patients your age, goes with your imaging, your test data, and your clinical history. It is possible that there is a more minor contributor from the vascular changes identified on your MRI but those are an unlikely sole cause and do not well explain your problem. This means that there will be progression over time.   The mainstay of treatment in dementia is lifestyle changes and providing support with activities that have become too challenging. In that vein, I would recommend that your family provide oversight of your medication administration, financial management, and other cognitively complex/memory dependent activities.   You are already on a good front-line medication for dementia.   There is now good quality  evidence from at least one large scale study that a modified mediterranean diet may help slow cognitive decline. This is known as the "MIND" diet. The Mind diet is not so much a specific diet as it is a set of recommendations for things that you should and should not eat.   The Alzheimer's association is a Geographical information systems officer for individuals with Alzheimer's disease and related disorders (ADRD). They offer a number of different services including support groups, a 24/7 crisis line, useful information, and activities such as their walk to end Alzheimer's to raise awareness about the disease. Many people benefit greatly from interacting with, learning from, and sharing information with others who are going through the same experience. Their website LimitL[employer]hu is a great place to start. If you are interested, I can make a referral for a social worker from the Alzheimer's association to contact you.  Foods that are ENCOURAGED on the MIND Diet:  Green, leafy vegetables: Aim for six or more servings per week. This includes kale, spinach, cooked greens and salads.  All other vegetables: Try to eat another vegetable in addition to the green leafy vegetables at least once a day. It is best to choose non-starchy vegetables because they have a lot of nutrients with a low number of calories.  Berries: Eat berries at least twice a week. There is a plethora of research on strawberries, and other berries such as blueberries, raspberries and blackberries have also been found to have antioxidant and brain health benefits.  Nuts: Try to get five servings of nuts or more each week. The creators of the MIND diet don't specify what kind of nuts to consume,  but it is probably best to vary the type of nuts you eat to obtain a variety of nutrients. Peanuts are a legume and do not fall into this category.  Olive oil: Use olive oil as your main cooking oil. There may be other heart-healthy alternatives such as algae oil,  though there is not yet sufficient research upon which to base a formal recommendation.  Whole grains: Aim for at least three servings daily. Choose minimally processed grains like oatmeal, quinoa, brown rice, whole-wheat pasta and 100% whole-wheat bread.  Fish: Eat fish at least once a week. It is best to choose fatty fish like salmon, sardines, trout, tuna and mackerel for their high amounts of omega-3 fatty acids.  Beans: Include beans in at least four meals every week. This includes all beans, lentils and soybeans.  Poultry: Try to eat chicken or [place] at least twice a week. Note that fried chicken is not encouraged on the MIND diet.  Wine: Aim for no more than one glass of alcohol daily. Both red and white wine may benefit the brain. However, much research has focused on the red wine compound resveratrol, which may help protect against Alzheimer's disease.  Foods that are DISCOURAGED on the MIND Diet: Butter and margarine: Try to eat less than 1 tablespoon (about 14 grams) daily. Instead, try using olive oil as your primary cooking fat, and dipping your bread in olive oil with herbs.  Cheese: The MIND diet recommends limiting your cheese consumption to less than once per week.  Red meat: Aim for no more than three servings each week. This includes all beef, pork, lamb and products made from these meats.  [place] food: The MIND diet highly discourages fried food, especially the kind from fast-food restaurants. Limit your consumption to less than once per week.  Pastries and sweets: This includes most of the processed junk food and desserts you can think of. Ice cream, cookies, brownies, snack cakes, donuts, candy and more. Try to limit these to no more than four times a week.  Exercise is one of the best medicines for promoting health and maintaining cognitive fitness at all stages in life. Exercise probably has the largest documented effect on brain health and performance of any lifestyle  intervention. Studies have shown that even previously sedentary individuals who start exercising as late as age 62 show a significant survival benefit as compared to their non-exercising peers. In the [place], the current guidelines are for 30 minutes of moderate exercise per day, but increasing your activity level less than that may also be helpful. You do not have to get your 30 minutes of exercise in one shot and exercising for short periods of time spread throughout the day can be helpful. Go for several walks, learn to dance, or do something else you enjoy that gets your body moving. Of course, if you have an underlying medical condition or there is any question about whether it is safe for you to exercise, you should consult a medical treatment provider prior to beginning exercise.

## 2020-04-10 NOTE — Progress Notes (Signed)
   [facility]  Feedback Note: I met with Julio Alm Crossan to review the findings resulting from her neuropsychological evaluation. Since the last appointment, she has been about the same.Time was spent reviewing the impressions and recommendations that are detailed in the evaluation report. We discussed impression of mild stage Alzheimer's dementia, as reflected in the patient instructions. She was very reasonable and did not get agitated whatsoever. Her daughters were involved. I also encouraged them to follow up with the PET scan that has been recommended by Dr. Jaynee Eagles, which will provide further diagnostic clarity. I took time to explain the findings and answer all the patient's questions. I encouraged Ms. Cercone to contact me should she have any further questions or if further follow up is desired.   Current Medications and Medical History   Current Outpatient Medications  Medication Sig Dispense Refill  . apixaban (ELIQUIS) 5 MG TABS tablet Take 1 tablet (5 mg total) by mouth 2 (two) times daily. 180 tablet 1  . atorvastatin (LIPITOR) 10 MG tablet Take 10 mg by mouth daily.    . Calcium Carb-Cholecalciferol (CALCIUM + D3) 600-800 MG-UNIT TABS Take 1 tablet by mouth daily.    Marland Kitchen donepezil (ARICEPT) 10 MG tablet Take 10 mg by mouth at bedtime.     . furosemide (LASIX) 20 MG tablet Take 1 tablet (20 mg total) by mouth daily as needed for edema. 30 tablet 1  . gabapentin (NEURONTIN) 100 MG capsule Take 100 mg by mouth 2 (two) times daily.    Marland Kitchen ketotifen (ZADITOR) 0.025 % ophthalmic solution 1 drop 2 (two) times daily. Thera Tears    . lisinopril (ZESTRIL) 20 MG tablet Take 1 tablet (20 mg total) by mouth daily. 30 tablet 0  . Multiple Vitamin (MULTI-VITAMINS) TABS Take 1 tablet by mouth daily.     . Omega-3 Fatty Acids (OMEGA-3 FISH OIL) 1200 MG CAPS Take 1,200 mg by mouth daily.      No current facility-administered medications for this visit.     Patient Active Problem List   Diagnosis Date Noted  . New onset atrial flutter ([place])   . Dizziness   . Afib ([place]) 03/01/2020  . Leg pain 01/10/2017  . PAD (peripheral artery disease) ([place]) 01/10/2017  . Menopause 07/08/2015  . Status post TAH-BSO 07/08/2015  . Rectocele 07/08/2015  . Increased BMI 07/08/2015  . Diverticulosis 07/08/2015  . PAC (premature atrial contraction) 12/13/2013  . Hyperlipidemia 12/13/2013  . Morbid obesity ([place]) 12/13/2013  . Essential hypertension 12/13/2013  . Arthritis, senescent 12/13/2013    Mental Status and Behavioral Observations  Carianna Lague Debellis presented on time to the present encounter and was alert and generally oriented. Speech was normal in rate, rhythm, volume, and prosody. Self-reported mood was "good" and affect was neutral. Thought process was logical and goal oriented, although there was some repeating, and thought content was appropriate. There were no safety concerns identified at today's encounter, such as thoughts of harming self or others.   Plan  Feedback provided regarding the patient's neuropsychological evaluation. She was very reasonable during the feedback and did not get agitated. I have encouraged them to follow up on the PET scan ordered by Dr. Jaynee Eagles.  Julio Alm Sultan was encouraged to contact me if any questions arise or if further follow up is desired.   Viviano Simas Nicole Kindred, PsyD, ABN Clinical Neuropsychologist  Service(s) Provided at This Encounter: 43 minutes [phone]; Conjoint therapy with patient present)

## 2020-05-01 NOTE — Telephone Encounter (Signed)
Both insurance denied patient's insurance . PET Scan  Tried x2 Dr. Rip Harbour because she has neuro Psych testing . Patient 's daughter are ok with this as well. Thanks Annabelle Harman .

## 2020-06-01 IMAGING — MG DIGITAL SCREENING BILAT W/ TOMO W/ CAD
8 series · 8 of 24 positions shown · non-contrast
Comparison: Previous exam(s).

CLINICAL DATA: Screening.

EXAM:
DIGITAL SCREENING BILATERAL MAMMOGRAM WITH TOMO AND CAD

[R CC synth-2D]
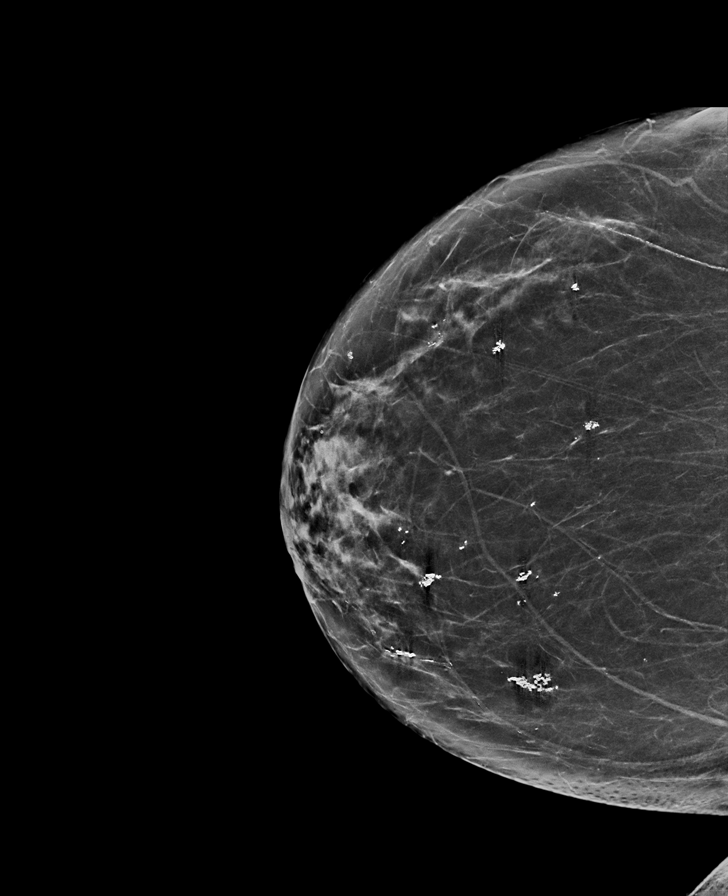

[R MLO synth-2D]
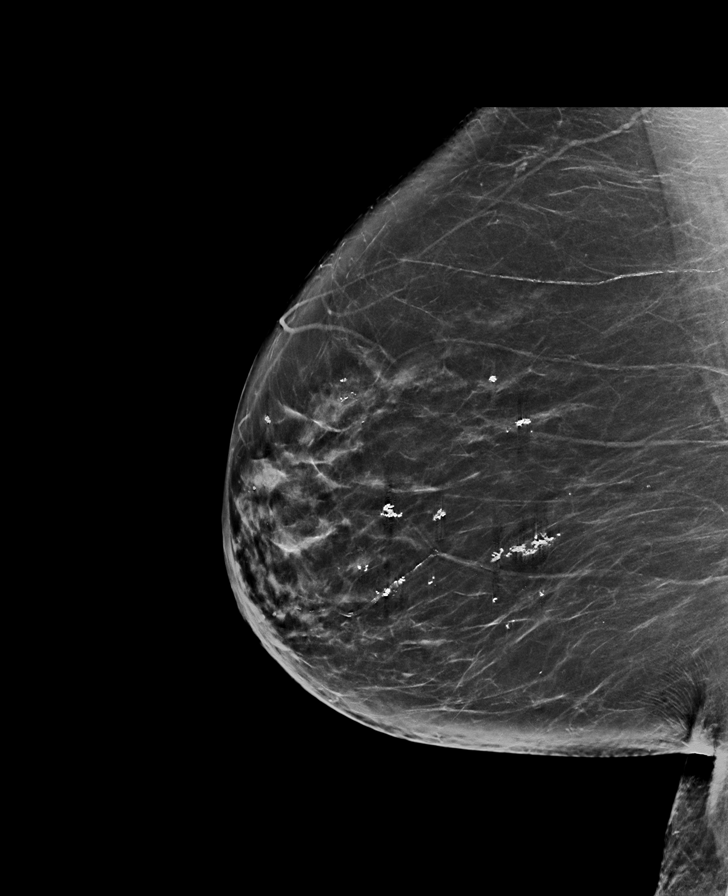

[L CC synth-2D]
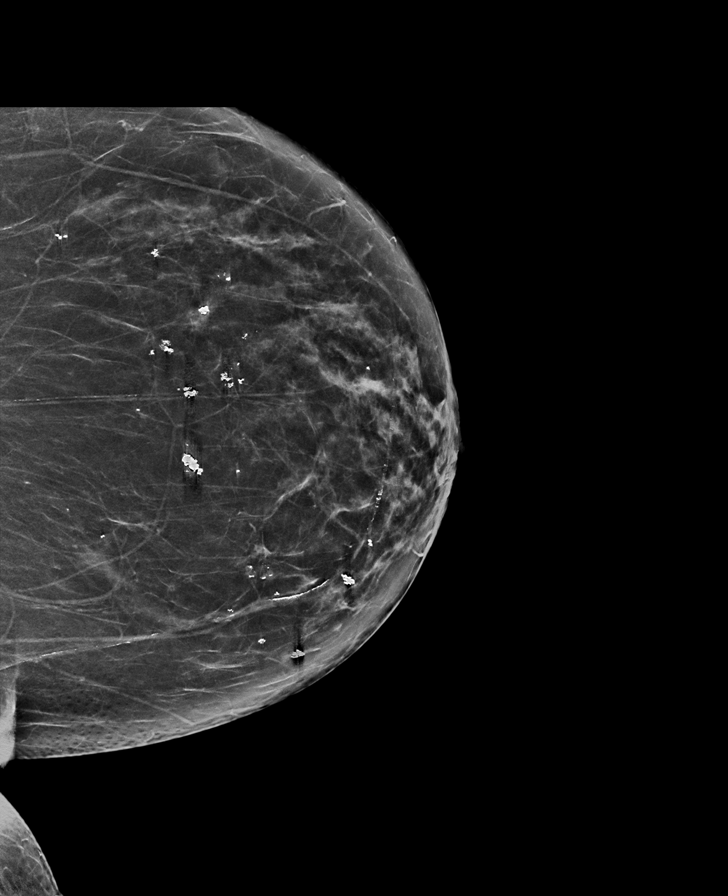

[L MLO synth-2D]
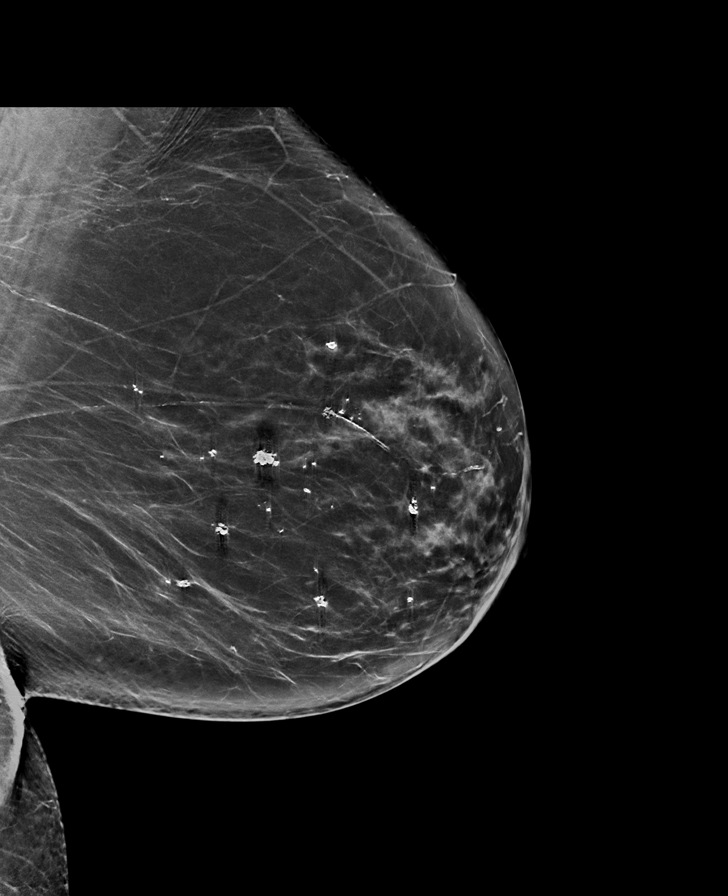

[R MLO tomo · tomo slice 39/76.0]
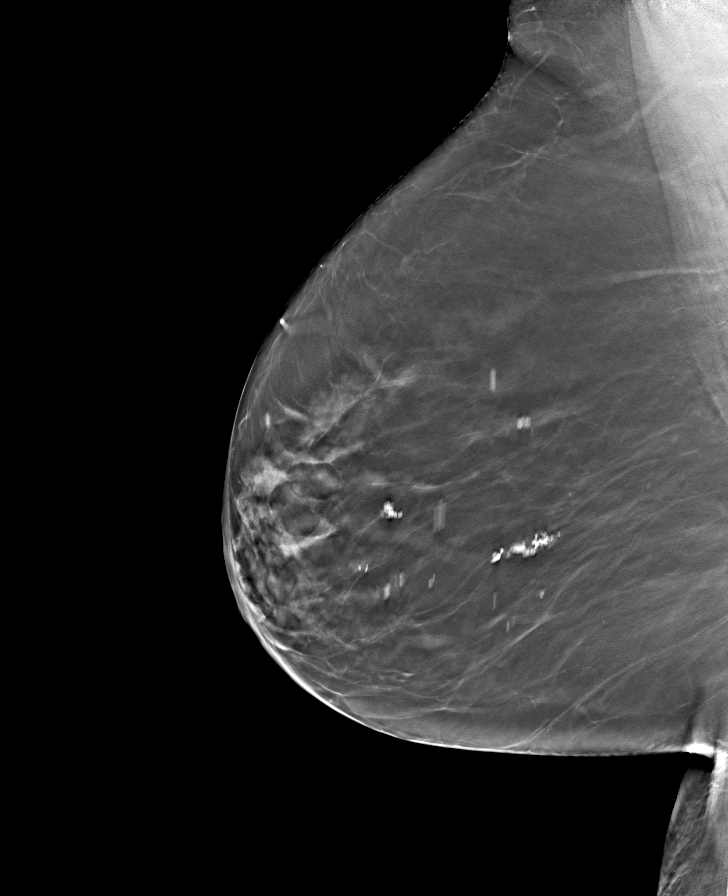

[L MLO tomo · tomo slice 39/78.0]
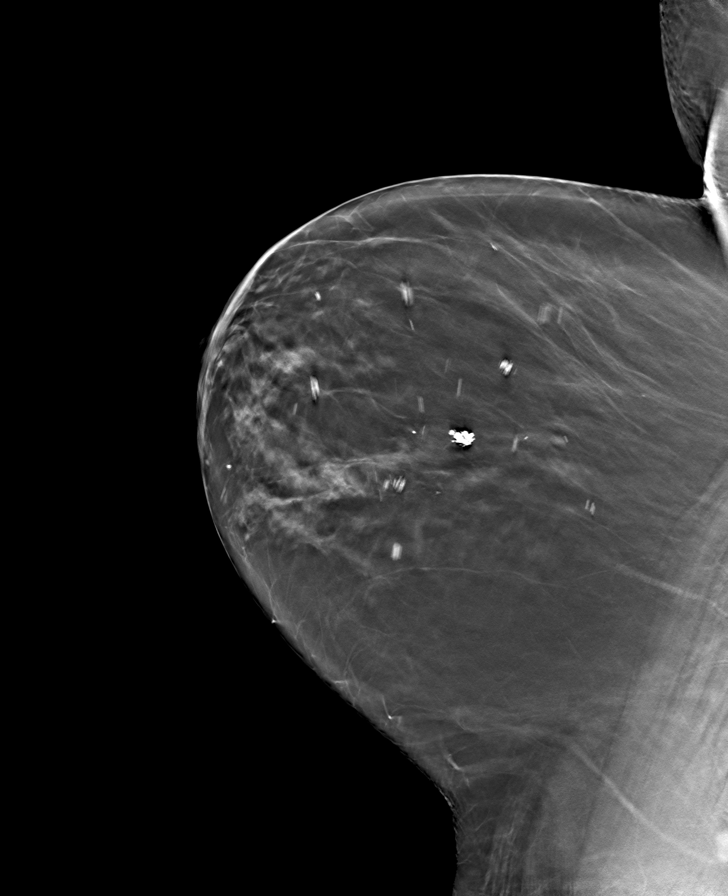

[R CC tomo · tomo slice 35/69.0]
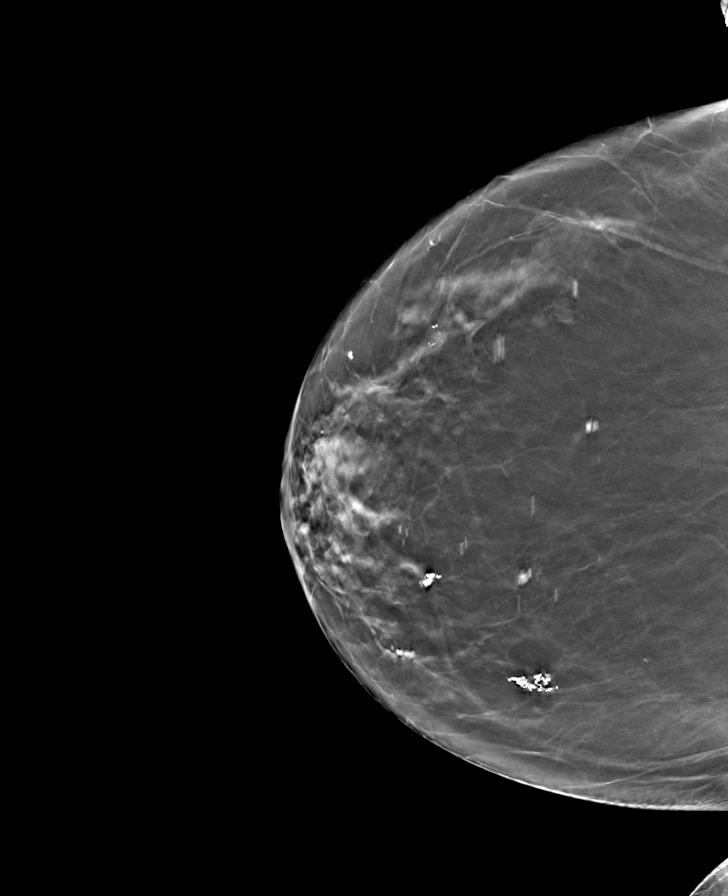

[L CC tomo · tomo slice 35/69.0]
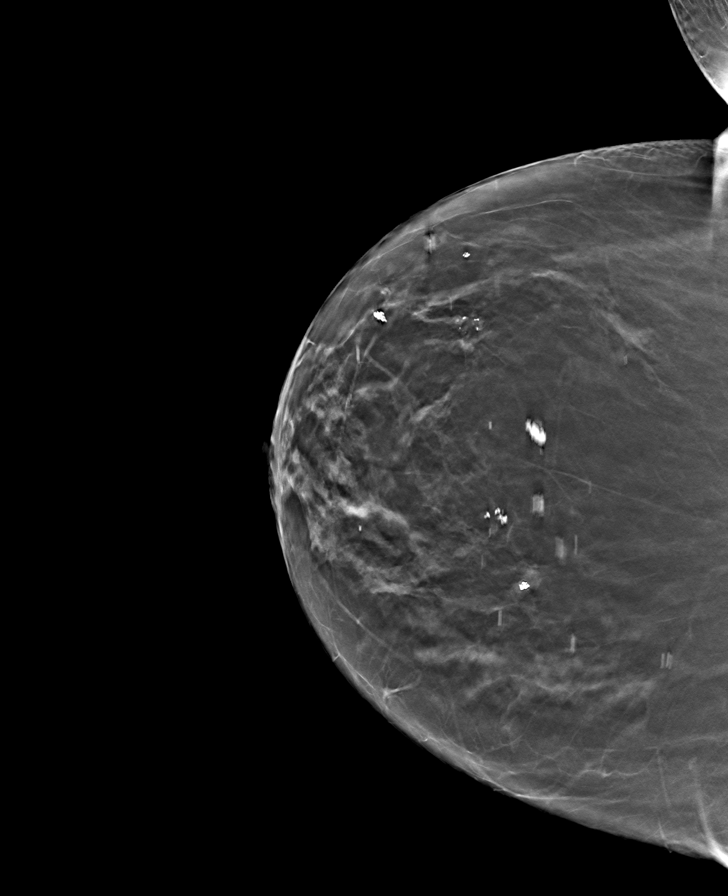

[8 of 24 positions shown; findings below may reference images not displayed]

ACR Breast Density Category c: The breast tissue is heterogeneously
dense, which may obscure small masses.
FINDINGS: There are no findings suspicious for malignancy. Images were
processed with CAD.
IMPRESSION: No mammographic evidence of malignancy. A result letter of this
screening mammogram will be mailed directly to the patient.

RECOMMENDATION:
Screening mammogram in one year. (Code:[id number])

BI-RADS CATEGORY  1: Negative.

## 2020-07-16 ENCOUNTER — Telehealth: Payer: Self-pay | Admitting: Neurology

## 2020-07-16 ENCOUNTER — Other Ambulatory Visit: Payer: Self-pay | Admitting: Neurology

## 2020-07-16 MED ORDER — DONEPEZIL HCL 10 MG PO TABS: 10.0000 mg | ORAL_TABLET | Freq: Every day | ORAL | 0 refills | Status: DC

## 2020-07-16 NOTE — Telephone Encounter (Signed)
Pt's daughter states pt will not have enough donepezil (ARICEPT) 10 MG tablet until appointment  Tues of next week.  Daughter is asking for more to be called in until appointment

## 2020-07-16 NOTE — Addendum Note (Signed)
Addended by: Bertram Savin on: 07/16/2020 04:04 PM   Modules accepted: Orders

## 2020-07-16 NOTE — Telephone Encounter (Signed)
Was not previously prescribed by Dr Lucia Gaskins. Spoke with Dr Lucia Gaskins who advised it was ok to send in a prescription for Aricept 10 mg QHS. Refill sent to pharmacy.

## 2020-07-22 ENCOUNTER — Ambulatory Visit (INDEPENDENT_AMBULATORY_CARE_PROVIDER_SITE_OTHER): Payer: Medicare Other | Admitting: Neurology

## 2020-07-22 ENCOUNTER — Encounter: Payer: Self-pay | Admitting: Neurology

## 2020-07-22 ENCOUNTER — Other Ambulatory Visit: Payer: Self-pay

## 2020-07-22 VITALS — BP 148/83 | HR 84 | Ht 64.25 in | Wt 208.0 lb

## 2020-07-22 DIAGNOSIS — F0281 Dementia in other diseases classified elsewhere with behavioral disturbance: Secondary | ICD-10-CM | POA: Diagnosis not present

## 2020-07-22 DIAGNOSIS — G301 Alzheimer's disease with late onset: Secondary | ICD-10-CM | POA: Diagnosis not present

## 2020-07-22 MED ORDER — MEMANTINE HCL 5 MG PO TABS: 5.0000 mg | ORAL_TABLET | Freq: Every evening | ORAL | 3 refills | Status: DC

## 2020-07-22 NOTE — Patient Instructions (Addendum)
No driving Continue Aricept/Donepezil I recommend Memantine  Also Zoloft may be good in the future   Mediterranean Diet A Mediterranean diet refers to food and lifestyle choices that are based on the traditions of countries located on the [employer] [place]. This way of eating has been shown to help prevent certain conditions and improve outcomes for people who have chronic diseases, like kidney disease and heart disease. What are tips for following this plan? Lifestyle  Cook and eat meals together with your family, when possible.  Drink enough fluid to keep your urine clear or pale yellow.  Be physically active every day. This includes: ? Aerobic exercise like running or swimming. ? Leisure activities like gardening, walking, or housework.  Get 7-8 hours of sleep each night.  If recommended by your health care provider, drink red wine in moderation. This means 1 glass a day for nonpregnant women and 2 glasses a day for men. A glass of wine equals 5 oz (150 mL). Reading food labels  Check the serving size of packaged foods. For foods such as rice and pasta, the serving size refers to the amount of cooked product, not dry.  Check the total fat in packaged foods. Avoid foods that have saturated fat or trans fats.  Check the ingredients list for added sugars, such as corn syrup.   Shopping  At the grocery store, buy most of your food from the areas near the walls of the store. This includes: ? Fresh fruits and vegetables (produce). ? Grains, beans, nuts, and seeds. Some of these may be available in unpackaged forms or large amounts (in bulk). ? Fresh seafood. ? Poultry and eggs. ? Low-fat dairy products.  Buy whole ingredients instead of prepackaged foods.  Buy fresh fruits and vegetables in-season from local farmers markets.  Buy frozen fruits and vegetables in resealable bags.  If you do not have access to quality fresh seafood, buy precooked frozen shrimp or canned fish,  such as tuna, salmon, or sardines.  Buy small amounts of raw or cooked vegetables, salads, or olives from the deli or salad bar at your store.  Stock your pantry so you always have certain foods on hand, such as olive oil, canned tuna, canned tomatoes, rice, pasta, and beans. Cooking  Cook foods with extra-virgin olive oil instead of using butter or other vegetable oils.  Have meat as a side dish, and have vegetables or grains as your main dish. This means having meat in small portions or adding small amounts of meat to foods like pasta or stew.  Use beans or vegetables instead of meat in common dishes like chili or lasagna.  Experiment with different cooking methods. Try roasting or broiling vegetables instead of steaming or sauteing them.  Add frozen vegetables to soups, stews, pasta, or rice.  Add nuts or seeds for added healthy fat at each meal. You can add these to yogurt, salads, or vegetable dishes.  Marinate fish or vegetables using olive oil, lemon juice, garlic, and fresh herbs. Meal planning  Plan to eat 1 vegetarian meal one day each week. Try to work up to 2 vegetarian meals, if possible.  Eat seafood 2 or more times a week.  Have healthy snacks readily available, such as: ? Vegetable sticks with hummus. ? [place] yogurt. ? Fruit and nut trail mix.  Eat balanced meals throughout the week. This includes: ? Fruit: 2-3 servings a day ? Vegetables: 4-5 servings a day ? Low-fat dairy: 2 servings a day ? Fish,  poultry, or lean meat: 1 serving a day ? Beans and legumes: 2 or more servings a week ? Nuts and seeds: 1-2 servings a day ? Whole grains: 6-8 servings a day ? Extra-virgin olive oil: 3-4 servings a day  Limit red meat and sweets to only a few servings a month   What are my food choices?  Mediterranean diet ? Recommended  Grains: Whole-grain pasta. Brown rice. Bulgar wheat. Polenta. Couscous. Whole-wheat bread. [place] Cobbatmeal. Quinoa.  Vegetables: Artichokes.  Beets. Broccoli. Cabbage. Carrots. Eggplant. Green beans. Chard. Kale. Spinach. Onions. Leeks. Peas. Squash. Tomatoes. Peppers. Radishes.  Fruits: Apples. Apricots. Avocado. Berries. Bananas. Cherries. Dates. Figs. Grapes. Lemons. Melon. Oranges. Peaches. Plums. Pomegranate.  Meats and other protein foods: Beans. Almonds. Sunflower seeds. Pine nuts. Peanuts. Cod. Salmon. Scallops. Shrimp. Tuna. Tilapia. Clams. Oysters. Eggs.  Dairy: Low-fat milk. Cheese. Greek yogurt.  Beverages: Water. Red wine. Herbal tea.  Fats and oils: Extra virgin olive oil. Avocado oil. Grape seed oil.  Sweets and desserts: [place] yogurt with honey. Baked apples. Poached pears. Trail mix.  Seasoning and other foods: Basil. Cilantro. Coriander. Cumin. Mint. Parsley. Sage. Rosemary. Tarragon. Garlic. Oregano. Thyme. Pepper. Balsalmic vinegar. Tahini. Hummus. Tomato sauce. Olives. Mushrooms. ? Limit these  Grains: Prepackaged pasta or rice dishes. Prepackaged cereal with added sugar.  Vegetables: Deep fried potatoes (french fries).  Fruits: Fruit canned in syrup.  Meats and other protein foods: Beef. Pork. Lamb. Poultry with skin. Hot dogs. Tomasa BlaseBacon.  Dairy: Ice cream. Sour cream. Whole milk.  Beverages: Juice. Sugar-sweetened soft drinks. Beer. Liquor and spirits.  Fats and oils: Butter. Canola oil. Vegetable oil. Beef fat (tallow). Lard.  Sweets and desserts: Cookies. Cakes. Pies. Candy.  Seasoning and other foods: Mayonnaise. Premade sauces and marinades. The items listed may not be a complete list. Talk with your dietitian about what dietary choices are right for you. Summary  The Mediterranean diet includes both food and lifestyle choices.  Eat a variety of fresh fruits and vegetables, beans, nuts, seeds, and whole grains.  Limit the amount of red meat and sweets that you eat.  Talk with your health care provider about whether it is safe for you to drink red wine in moderation. This means 1 glass a day  for nonpregnant women and 2 glasses a day for men. A glass of wine equals 5 oz (150 mL). This information is not intended to replace advice given to you by your health care provider. Make sure you discuss any questions you have with your health care provider. Document Revised: 11/13/2015 Document Reviewed: 11/06/2015 Elsevier Patient Education  2020 [employer]  Memantine Tablets What is this medicine? MEMANTINE (MEM an teen) is used to treat dementia caused by Alzheimer's disease. This medicine may be used for other purposes; ask your health care provider or pharmacist if you have questions. COMMON BRAND NAME(S): Namenda What should I tell my health care provider before I take this medicine? They need to know if you have any of these conditions:  difficulty passing urine  kidney disease  liver disease  seizures  an unusual or allergic reaction to memantine, other medicines, foods, dyes, or preservatives  pregnant or trying to get pregnant  breast-feeding How should I use this medicine? Take this medicine by mouth with a glass of water. Follow the directions on the prescription label. You may take this medicine with or without food. Take your doses at regular intervals. Do not take your medicine more often than directed. Continue to take your medicine  even if you feel better. Do not stop taking except on the advice of your doctor or health care professional. Talk to your pediatrician regarding the use of this medicine in children. Special care may be needed. Overdosage: If you think you have taken too much of this medicine contact a poison control center or emergency room at once. NOTE: This medicine is only for you. Do not share this medicine with others. What if I miss a dose? If you miss a dose, take it as soon as you can. If it is almost time for your next dose, take only that dose. Do not take double or extra doses. If you do not take your medicine for several days, contact  your health care provider. Your dose may need to be changed. What may interact with this medicine?  acetazolamide  amantadine  cimetidine  dextromethorphan  dofetilide  hydrochlorothiazide  ketamine  metformin  methazolamide  quinidine  ranitidine  sodium bicarbonate  triamterene This list may not describe all possible interactions. Give your health care provider a list of all the medicines, herbs, non-prescription drugs, or dietary supplements you use. Also tell them if you smoke, drink alcohol, or use illegal drugs. Some items may interact with your medicine. What should I watch for while using this medicine? Visit your doctor or health care professional for regular checks on your progress. Check with your doctor or health care professional if there is no improvement in your symptoms or if they get worse. You may get drowsy or dizzy. Do not drive, use machinery, or do anything that needs mental alertness until you know how this drug affects you. Do not stand or sit up quickly, especially if you are an older patient. This reduces the risk of dizzy or fainting spells. Alcohol can make you more drowsy and dizzy. Avoid alcoholic drinks. What side effects may I notice from receiving this medicine? Side effects that you should report to your doctor or health care professional as soon as possible:  allergic reactions like skin rash, itching or hives, swelling of the face, lips, or tongue  agitation or a feeling of restlessness  depressed mood  dizziness  hallucinations  redness, blistering, peeling or loosening of the skin, including inside the mouth  seizures  vomiting Side effects that usually do not require medical attention (report to your doctor or health care professional if they continue or are bothersome):  constipation  diarrhea  headache  nausea  trouble sleeping This list may not describe all possible side effects. Call your doctor for medical advice  about side effects. You may report side effects to FDA at 1-800-FDA-1088. Where should I keep my medicine? Keep out of the reach of children. Store at room temperature between 15 degrees and 30 degrees C (59 degrees and 86 degrees F). Throw away any unused medicine after the expiration date. NOTE: This sheet is a summary. It may not cover all possible information. If you have questions about this medicine, talk to your doctor, pharmacist, or health care provider.  2021 Elsevier/Gold Standard (2013-01-01 14:10:42)  Sertraline Tablets What is this medicine? SERTRALINE (SER tra leen) is used to treat depression. It may also be used to treat obsessive compulsive disorder, panic disorder, post-trauma stress disorder, premenstrual dysphoric disorder (PMDD) or social anxiety. This medicine may be used for other purposes; ask your health care provider or pharmacist if you have questions. COMMON BRAND NAME(S): Zoloft What should I tell my health care provider before I take this medicine?  They need to know if you have any of these conditions:  bleeding disorders  bipolar disorder or a family history of bipolar disorder  glaucoma  heart disease  high blood pressure  history of irregular heartbeat  history of low levels of calcium, magnesium, or potassium in the blood  if you often drink alcohol  liver disease  receiving electroconvulsive therapy  seizures  suicidal thoughts, plans, or attempt; a previous suicide attempt by you or a family member  take medicines that treat or prevent blood clots  thyroid disease  an unusual or allergic reaction to sertraline, other medicines, foods, dyes, or preservatives  pregnant or trying to get pregnant  breast-feeding How should I use this medicine? Take this medicine by mouth with a glass of water. Follow the directions on the prescription label. You can take it with or without food. Take your medicine at regular intervals. Do not take  your medicine more often than directed. Do not stop taking this medicine suddenly except upon the advice of your doctor. Stopping this medicine too quickly may cause serious side effects or your condition may worsen. A special MedGuide will be given to you by the pharmacist with each prescription and refill. Be sure to read this information carefully each time. Talk to your pediatrician regarding the use of this medicine in children. While this drug may be prescribed for children as young as 7 years for selected conditions, precautions do apply. Overdosage: If you think you have taken too much of this medicine contact a poison control center or emergency room at once. NOTE: This medicine is only for you. Do not share this medicine with others. What if I miss a dose? If you miss a dose, take it as soon as you can. If it is almost time for your next dose, take only that dose. Do not take double or extra doses. What may interact with this medicine? Do not take this medicine with any of the following medications:  cisapride  dronedarone  linezolid  MAOIs like Carbex, Eldepryl, Marplan, Nardil, and Parnate  methylene blue (injected into a vein)  pimozide  thioridazine This medicine may also interact with the following medications:  alcohol  amphetamines  aspirin and aspirin-like medicines  certain medicines for depression, anxiety, or psychotic disturbances  certain medicines for fungal infections like ketoconazole, fluconazole, posaconazole, and itraconazole  certain medicines for irregular heart beat like flecainide, quinidine, propafenone  certain medicines for migraine headaches like almotriptan, eletriptan, frovatriptan, naratriptan, rizatriptan, sumatriptan, zolmitriptan  certain medicines for sleep  certain medicines for seizures like carbamazepine, valproic acid, phenytoin  certain medicines that treat or prevent blood clots like warfarin, enoxaparin,  dalteparin  cimetidine  digoxin  diuretics  fentanyl  isoniazid  lithium  NSAIDs, medicines for pain and inflammation, like ibuprofen or naproxen  other medicines that prolong the QT interval (cause an abnormal heart rhythm) like dofetilide  rasagiline  safinamide  supplements like St. John's wort, kava kava, valerian  tolbutamide  tramadol  tryptophan This list may not describe all possible interactions. Give your health care provider a list of all the medicines, herbs, non-prescription drugs, or dietary supplements you use. Also tell them if you smoke, drink alcohol, or use illegal drugs. Some items may interact with your medicine. What should I watch for while using this medicine? Tell your doctor if your symptoms do not get better or if they get worse. Visit your doctor or health care professional for regular checks on your progress. Because  it may take several weeks to see the full effects of this medicine, it is important to continue your treatment as prescribed by your doctor. Patients and their families should watch out for new or worsening thoughts of suicide or depression. Also watch out for sudden changes in feelings such as feeling anxious, agitated, panicky, irritable, hostile, aggressive, impulsive, severely restless, overly excited and hyperactive, or not being able to sleep. If this happens, especially at the beginning of treatment or after a change in dose, call your health care professional. Bonita Quin may get drowsy or dizzy. Do not drive, use machinery, or do anything that needs mental alertness until you know how this medicine affects you. Do not stand or sit up quickly, especially if you are an older patient. This reduces the risk of dizzy or fainting spells. Alcohol may interfere with the effect of this medicine. Avoid alcoholic drinks. Your mouth may get dry. Chewing sugarless gum or sucking hard candy, and drinking plenty of water may help. Contact your doctor if  the problem does not go away or is severe. What side effects may I notice from receiving this medicine? Side effects that you should report to your doctor or health care professional as soon as possible:  allergic reactions like skin rash, itching or hives, swelling of the face, lips, or tongue  anxious  black, tarry stools  changes in vision  confusion  elevated mood, decreased need for sleep, racing thoughts, impulsive behavior  eye pain  fast, irregular heartbeat  feeling faint or lightheaded, falls  feeling agitated, angry, or irritable  hallucination, loss of contact with reality  loss of balance or coordination  loss of memory  painful or prolonged erections  restlessness, pacing, inability to keep still  seizures  stiff muscles  suicidal thoughts or other mood changes  trouble sleeping  unusual bleeding or bruising  unusually weak or tired  vomiting Side effects that usually do not require medical attention (report to your doctor or health care professional if they continue or are bothersome):  change in appetite or weight  change in sex drive or performance  diarrhea  increased sweating  indigestion, nausea  tremors This list may not describe all possible side effects. Call your doctor for medical advice about side effects. You may report side effects to FDA at 1-800-FDA-1088. Where should I keep my medicine? Keep out of the reach of children. Store at room temperature between 15 and 30 degrees C (59 and 86 degrees F). Throw away any unused medicine after the expiration date. NOTE: This sheet is a summary. It may not cover all possible information. If you have questions about this medicine, talk to your doctor, pharmacist, or health care provider.  2021 Elsevier/Gold Standard (2020-01-24 09:18:23)
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
## 2020-08-04 ENCOUNTER — Other Ambulatory Visit: Payer: Self-pay | Admitting: Neurology

## 2020-09-10 ENCOUNTER — Ambulatory Visit (INDEPENDENT_AMBULATORY_CARE_PROVIDER_SITE_OTHER): Payer: Medicare Other | Admitting: Cardiovascular Disease

## 2020-09-10 ENCOUNTER — Other Ambulatory Visit: Payer: Self-pay

## 2020-09-10 ENCOUNTER — Encounter: Payer: Self-pay | Admitting: Cardiovascular Disease

## 2020-09-10 VITALS — BP 126/70 | HR 67 | Ht 66.5 in | Wt 208.4 lb

## 2020-09-10 DIAGNOSIS — I10 Essential (primary) hypertension: Secondary | ICD-10-CM | POA: Diagnosis not present

## 2020-09-10 MED ORDER — FUROSEMIDE 20 MG PO TABS: 20.0000 mg | ORAL_TABLET | ORAL | 3 refills | Status: DC

## 2020-09-10 NOTE — Patient Instructions (Addendum)
Medication Instructions:  Lasix 20 mg  every other day  and as needed for 3 pound weight gain Take potassium when you take lasix  If you need a refill on your cardiac medications before your next appointment, please call your pharmacy.   Lab work: No new labs needed  Testing/Procedures: No new testing needed  Follow-Up:  You will need a follow up appointment in 6 months  Providers on your designated Care Team:   Briana Ducking, NP Briana Listen, PA-C Briana Ivan, PA-C Briana Howe, [place]  COVID-19 Vaccine Information can be found at: [email or web address] For questions related to vaccine distribution or appointments, please email [email or web address] or call 5701848892.
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
## 2020-10-14 ENCOUNTER — Telehealth: Payer: Self-pay | Admitting: Cardiovascular Disease

## 2020-10-14 MED ORDER — APIXABAN 5 MG PO TABS: 5.0000 mg | ORAL_TABLET | Freq: Two times a day (BID) | ORAL | 1 refills | Status: DC

## 2020-10-14 NOTE — Telephone Encounter (Signed)
Please review for refill, thanks ! 

## 2020-10-14 NOTE — Telephone Encounter (Signed)
*  STAT* If patient is at the pharmacy, call can be transferred to refill team.   1. Which medications need to be refilled? (please list name of each medication and dose if known) Eliquis 5mg  1 tablet daily  2. Which pharmacy/location (including street and city if local pharmacy) is medication to be sent to?[facility],  3. Do they need a 30 day or 90 day supply? 90 day supply

## 2020-10-14 NOTE — Telephone Encounter (Signed)
Prescription refill request for Eliquis received. Indication: Atrial fib Last office visit: 09/10/20  Concha Se MD Scr: 1.0 on 08/15/20 Age: 87 Weight: 94.5kg  Based on above findings Eliquis 5mg  twice daily is the appropriate dose.  Refill approved.

## 2020-12-22 ENCOUNTER — Encounter: Payer: Self-pay | Admitting: Neurology

## 2020-12-22 ENCOUNTER — Ambulatory Visit (INDEPENDENT_AMBULATORY_CARE_PROVIDER_SITE_OTHER): Payer: Medicare Other | Admitting: Neurology

## 2020-12-22 VITALS — BP 169/82 | HR 66 | Ht 66.0 in | Wt 221.0 lb

## 2020-12-22 DIAGNOSIS — F02818 Dementia in other diseases classified elsewhere, unspecified severity, with other behavioral disturbance: Secondary | ICD-10-CM

## 2020-12-22 DIAGNOSIS — F0281 Dementia in other diseases classified elsewhere with behavioral disturbance: Secondary | ICD-10-CM | POA: Diagnosis not present

## 2020-12-22 DIAGNOSIS — G301 Alzheimer's disease with late onset: Secondary | ICD-10-CM

## 2020-12-22 MED ORDER — MEMANTINE HCL 5 MG PO TABS: 5.0000 mg | ORAL_TABLET | Freq: Two times a day (BID) | ORAL | 3 refills | Status: DC

## 2020-12-22 MED ORDER — SERTRALINE HCL 50 MG PO TABS: 50.0000 mg | ORAL_TABLET | Freq: Every day | ORAL | 6 refills | Status: DC

## 2020-12-22 MED ORDER — DONEPEZIL HCL 10 MG PO TABS: ORAL_TABLET | ORAL | 4 refills | Status: DC
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
## 2020-12-22 NOTE — Patient Instructions (Addendum)
Increase Memantine (memory medication) to 5mg  twice daily. Can further increase to 10 mg twice daily if no side effects in 2 weeks. Send me a . Start Zoloft 50mg  at bedtime. We can increase to 100mg  in 2-3 weeks if no side effects. For mood.Sendme a [employer] Continue Aricept for memory  Dementia Caregiver Guide Dementia is a term used to describe a number of symptoms that affect memory and thinking. The most common symptoms include: Memory loss. Trouble with language and communication. Trouble concentrating. Poor judgment and problems with reasoning. Wandering from home or public places. Extreme anxiety or depression. Being suspicious or having angry outbursts and accusations. Child-like behavior and language. Dementia can be frightening and confusing. And taking care of someone with dementia can be challenging. This guide provides tips to help you when providing care for a person with dementia. How to help manage lifestyle changes Dementia usually gets worse slowly over time. In the early stages, people with dementia can stay independent and safe with some help. In later stages, they need help with daily tasks such as dressing, grooming, and using the bathroom. There are actions you can take to help a person manage his or her life while living with this condition. Communicating When the person is talking or seems frustrated, make eye contact and hold the person's hand. Ask specific questions that need yes or no answers. Use simple words, short sentences, and a calm voice. Only give one direction at a time. When offering choices, limit the person to just one or two. Avoid correcting the person in a negative way. If the person is struggling to find the right words, gently try to help him or her. Preventing injury  Keep floors clear of clutter. Remove rugs, magazine racks, and floor lamps. Keep hallways well lit, especially at night. Put a handrail and nonslip mat in  the bathtub or shower. Put childproof locks on cabinets that contain dangerous items, such as medicines, alcohol, guns, toxic cleaning items, sharp tools or utensils, matches, and lighters. For doors to the outside of the house, put the locks in places where the person cannot see or reach them easily. This will help ensure that the person does not wander out of the house and get lost. Be prepared for emergencies. Keep a list of emergency phone numbers and addresses in a convenient area. Remove car keys and lock garage doors so that the person does not try to get in the car and drive. Have the person wear a bracelet that tracks locations and identifies the person as having memory problems. This should be worn at all times for safety. Helping with daily life  Keep the person on track with his or her routine. Try to identify areas where the person may need help. Be supportive, patient, calm, and encouraging. Gently remind the person that adjusting to changes takes time. Help with the tasks that the person has asked for help with. Keep the person involved in daily tasks and decisions as much as possible. Encourage conversation, but try not to get frustrated if the person struggles to find words or does not seem to appreciate your help. How to recognize stress Look for signs of stress in yourself and in the person you are caring for. If you notice signs of stress, take steps to manage it. Symptoms of stress include: Feeling anxious, irritable, frustrated, or angry. Denying that the person has dementia or that his or her symptoms will not improve. Feeling depressed, hopeless, or unappreciated.  Difficulty sleeping. Difficulty concentrating. Developing stress-related health problems. Feeling like you have too little time for your own life. Follow these instructions at home: Take care of your health Make sure that you and the person you are caring for: Get regular sleep. Exercise regularly. Eat  regular, nutritious meals. Take over-the-counter and prescription medicines only as told by your health care providers. Drink enough fluid to keep your urine pale yellow. Attend all scheduled health care appointments.  General instructions Join a support group with others who are caregivers. Ask about respite care resources. Respite care can provide short-term care for the person so that you can have a regular break from the stress of caregiving. Consider any safety risks and take steps to avoid them. Organize medicines in a pill box for each day of the week. Create a plan to handle any legal or financial matters. Get legal or financial advice if needed. Keep a calendar in a central location to remind the person of appointments or other activities. Where to find support: Many individuals and organizations offer support. These include: Support groups for people with dementia. Support groups for caregivers. Counselors or therapists. Home health care services. Adult day care centers. Where to find more information Centers for Disease Control and Prevention: FootballExhibition.com.br Alzheimer's Association: LimitLaws.hu Family Caregiver Alliance: www.caregiver.org Alzheimer's Foundation of [place]: www.alzfdn.org Contact a health care provider if: The person's health is rapidly getting worse. You are no longer able to care for the person. Caring for the person is affecting your physical and emotional health. You are feeling depressed or anxious about caring for the person. Get help right away if: The person threatens himself or herself, you, or anyone else. You feel depressed or sad, or feel that you want to harm yourself. If you ever feel like your loved one may hurt himself or herself or others, or if he or she shares thoughts about taking his or her own life, get help right away. You can go to your nearest emergency department or: Call your local emergency services (911 in the [place] Call a  suicide crisis helpline, such as the National Suicide Prevention Lifeline at 2076449062. This is open 24 hours a day in the [place] Text the Crisis Text Line at (630)484-1708 (in the [place] Summary Dementia is a term used to describe a number of symptoms that affect memory and thinking. Dementia usually gets worse slowly over time. Take steps to reduce the person's risk of injury and to plan for future care. Caregivers need support, relief from caregiving, and time for their own lives. This information is not intended to replace advice given to you by your health care provider. Make sure you discuss any questions you have with your health care provider. Document Revised: 07/30/2019 Document Reviewed: 07/30/2019 Elsevier Patient Education  2022 Else[employer] Inc.   Sertraline Solution What is this medication? SERTRALINE (SER tra leen) treats depression, anxiety, obsessive-compulsive disorder (OCD), irritability,    post-traumatic stress disorder (PTSD), and premenstrual dysphoric disorder (PMDD). It increases the amount of serotonin in the brain, a hormone that helps regulate mood. It belongs to a group of medications called SSRIs. This medicine may be used for other purposes; ask your health care provider or pharmacist if you have questions. COMMON BRAND NAME(S): Zoloft What should I tell my care team before I take this medication? They need to know if you have any of these conditions: Bleeding disorders Bipolar disorder or a family history of bipolar disorder Glaucoma Heart disease High blood pressure History  of irregular heartbeat History of low levels of calcium, magnesium, or potassium in the blood If you often drink alcohol Liver disease Receiving electroconvulsive therapy Seizures Suicidal thoughts, plans, or attempt; a previous suicide attempt by you or a family member Take medications that treat or prevent blood clots Thyroid disease An unusual or allergic reaction to sertraline, other  medications, foods, dyes, or preservatives Pregnant or trying to get pregnant Breast-feeding How should I use this medication? Take this medication by mouth. Follow the directions on the prescription label. Before taking your dose, you need to dilute the solution in a beverage. Measure your medication dose using the dropper in the bottle. Next, place the measured dose in at least 4 ounces (one-half cup) of water, ginger-ale, lemon-lime soda, lemonade or orange juice and mix. Do not mix in grapefruit juice or in any other liquids other than those listed. Drink all of mixed liquid immediately. Do not mix the dose and store it for later. It must be taken right away. You may take this medication with or without food. Take your medication at regular intervals. Do not take your medication more often than directed. Do not stop taking this medication suddenly except upon the advice of your care team. Stopping this medication too quickly may cause serious side effects or your condition may worsen. A special MedGuide will be given to you by the pharmacist with each prescription and refill. Be sure to read this information carefully each time. Talk to your care team about the use of this medication in children. While this medication may be prescribed for children as young as 7 years for selected conditions, precautions do apply. Overdosage: If you think you have taken too much of this medicine contact a poison control center or emergency room at once. NOTE: This medicine is only for you. Do not share this medicine with others. What if I miss a dose? If you miss a dose, take it as soon as you can. If it is almost time for your next dose, take only that dose. Do not take double or extra doses. What may interact with this medication? Do not take this medication with any of the following: Cisapride Dronedarone Linezolid MAOIs like Carbex, Eldepryl, Marplan, Nardil, and Parnate Methylene blue (injected into a  vein) Pimozide Thioridazine This medication may also interact with the following: Alcohol Amphetamines Aspirin and aspirin-like medications Certain medications for depression, anxiety, or psychotic disturbances Certain medications for fungal infections like ketoconazole, fluconazole, posaconazole, and itraconazole Certain medications for irregular heart beat like flecainide, quinidine, propafenone Certain medications for migraine headaches like almotriptan, eletriptan, frovatriptan, naratriptan, rizatriptan, sumatriptan, zolmitriptan Certain medications for sleep Certain medications for seizures like carbamazepine, valproic acid, phenytoin Certain medications that treat or prevent blood clots like warfarin, enoxaparin, dalteparin Cimetidine Digoxin Diuretics Fentanyl Isoniazid Lithium NSAIDs, medications for pain and inflammation, like ibuprofen or naproxen Other medications that prolong the QT interval (cause an abnormal heart rhythm) like dofetilide Rasagiline Safinamide Supplements like St. John's wort, kava kava, valerian Tolbutamide Tramadol Tryptophan This list may not describe all possible interactions. Give your health care provider a list of all the medicines, herbs, non-prescription drugs, or dietary supplements you use. Also tell them if you smoke, drink alcohol, or use illegal drugs. Some items may interact with your medicine. What should I watch for while using this medication? Tell your health care provider if your symptoms do not get better or if they get worse. Visit your health care provider for regular checks on your  progress. Because it may take several weeks to see the full effects of this medication, it is important to continue your treatment as prescribed by your health care provider. Patients and their families should watch out for new or worsening thoughts of suicide or depression. Also watch out for sudden changes in feelings such as feeling anxious, agitated,  panicky, irritable, hostile, aggressive, impulsive, severely restless, overly excited and hyperactive, or not being able to sleep. If this happens, especially at the beginning of treatment or after a change in dose, call your health care provider. You may get drowsy or dizzy. Do not drive, use machinery, or do anything that needs mental alertness until you know how this medication affects you. Do not stand or sit up quickly, especially if you are an older patient. This reduces the risk of dizzy or fainting spells. Alcohol may interfere with the effect of this medication. Your mouth may get dry. Chewing sugarless gum or sucking hard candy, and drinking plenty of water may help. Contact your health care provider if the problem does not go away or is severe. Some products may contain alcohol. Ask your pharmacist or healthcare provider if this medication contains alcohol. Be sure to tell all healthcare providers you are taking this medication. Certain medications, like metronidazole and disulfiram, can cause an unpleasant reaction when taken with alcohol. The reaction includes flushing, headache, nausea, vomiting, sweating, and increased thirst. The reaction can last from 30 minutes to several hours. What side effects may I notice from receiving this medication? Side effects that you should report to your care team as soon as possible: Allergic reactions-skin rash, itching, hives, swelling of the face, lips, tongue, or throat Bleeding-bloody or black, tar-like stools, red or dark brown urine, vomiting blood or brown material that looks like coffee grounds, small red or purple spots on skin, unusual bleeding or bruising Heart rhythm changes-fast or irregular heartbeat, dizziness, feeling faint or lightheaded, chest pain, trouble breathing Low sodium level-muscle weakness, fatigue, dizziness, headache, confusion Serotonin syndrome-irritability, confusion, fast or irregular heartbeat, muscle stiffness, twitching  muscles, sweating, high fever, seizure, chills, vomiting, diarrhea Sudden eye pain or change in vision such as blurred vision, seeing halos around lights, vision loss Thoughts of suicide or self-harm, worsening mood Side effects that usually do not require medical attention (report these to your care team if they continue or are bothersome): Change in sex drive or performance Diarrhea Excessive sweating Nausea Tremors or shaking Upset stomach This list may not describe all possible side effects. Call your doctor for medical advice about side effects. You may report side effects to FDA at 1-800-FDA-1088. Where should I keep my medication? Keep out of the reach of children and pets. Store at room temperature between 15 and 30 degrees C (59 and 86 degrees F). Get rid of any unused medication after the expiration date. To get rid of medications that are no longer needed or expired: Take the medication to a medication take-back program. Check with your pharmacy or law enforcement to find a location. If you cannot return the medication, check the label or package insert to see if the medication should be thrown out in the garbage or flushed down the toilet. If you are not sure, ask your care team. If it is safe to put in the trash, empty the medication out of the container. Mix the medication with cat litter, dirt, coffee grounds, or other unwanted substance. Seal the mixture in a bag or container. Put it in the trash.  NOTE: This sheet is a summary. It may not cover all possible information. If you have questions about this medicine, talk to your doctor, pharmacist, or health care provider.  2022 Elsevier/Gold Standard (2020-04-11 12:30:10)

## 2021-01-10 ENCOUNTER — Other Ambulatory Visit: Payer: Self-pay | Admitting: Neurology

## 2021-01-12 ENCOUNTER — Telehealth: Payer: Self-pay | Admitting: *Deleted

## 2021-01-12 NOTE — Telephone Encounter (Signed)
Called patient daughter back to make her aware the Aricept is ready for pickup

## 2021-02-25 ENCOUNTER — Other Ambulatory Visit: Payer: Self-pay | Admitting: Internal Medicine

## 2021-02-25 DIAGNOSIS — Z1231 Encounter for screening mammogram for malignant neoplasm of breast: Secondary | ICD-10-CM

## 2021-03-25 ENCOUNTER — Ambulatory Visit
Admission: RE | Admit: 2021-03-25 | Discharge: 2021-03-25 | Disposition: A | Discharge status: Home / Self Care | Payer: Medicare Other | Source: Ambulatory Visit | Attending: Internal Medicine | Admitting: Internal Medicine

## 2021-03-25 ENCOUNTER — Other Ambulatory Visit: Payer: Self-pay

## 2021-03-25 DIAGNOSIS — Z1231 Encounter for screening mammogram for malignant neoplasm of breast: Secondary | ICD-10-CM | POA: Diagnosis not present

## 2021-04-10 ENCOUNTER — Other Ambulatory Visit: Payer: Self-pay | Admitting: Cardiovascular Disease

## 2021-04-10 NOTE — Telephone Encounter (Signed)
Refill Request.  

## 2021-04-10 NOTE — Telephone Encounter (Signed)
Eliquis 5 mg refill request received. Patient is 88 years old, weight- 100.2 kg, Crea- 1.0 on 08/15/20, Diagnosis-PAF, and last seen by Dr. Rockey Situ on 09/10/20. Dose is appropriate based on dosing criteria. Will send in refill to requested pharmacy.

## 2021-06-07 ENCOUNTER — Observation Stay
Admission: EM | Admit: 2021-06-07 | Discharge: 2021-06-08 | Disposition: A | Discharge status: Home / Self Care | Payer: Medicare Other | Attending: Internal Medicine | Admitting: Internal Medicine

## 2021-06-07 ENCOUNTER — Other Ambulatory Visit: Payer: Self-pay

## 2021-06-07 ENCOUNTER — Observation Stay: Payer: Medicare Other

## 2021-06-07 ENCOUNTER — Encounter: Payer: Self-pay | Admitting: Emergency Medicine

## 2021-06-07 DIAGNOSIS — I110 Hypertensive heart disease with heart failure: Secondary | ICD-10-CM | POA: Diagnosis not present

## 2021-06-07 DIAGNOSIS — G301 Alzheimer's disease with late onset: Secondary | ICD-10-CM | POA: Diagnosis present

## 2021-06-07 DIAGNOSIS — R42 Dizziness and giddiness: Secondary | ICD-10-CM | POA: Diagnosis present

## 2021-06-07 DIAGNOSIS — Z7901 Long term (current) use of anticoagulants: Secondary | ICD-10-CM | POA: Diagnosis not present

## 2021-06-07 DIAGNOSIS — Z96649 Presence of unspecified artificial hip joint: Secondary | ICD-10-CM | POA: Insufficient documentation

## 2021-06-07 DIAGNOSIS — F02818 Dementia in other diseases classified elsewhere, unspecified severity, with other behavioral disturbance: Secondary | ICD-10-CM | POA: Diagnosis not present

## 2021-06-07 DIAGNOSIS — Z20822 Contact with and (suspected) exposure to covid-19: Secondary | ICD-10-CM | POA: Insufficient documentation

## 2021-06-07 DIAGNOSIS — Z79899 Other long term (current) drug therapy: Secondary | ICD-10-CM | POA: Insufficient documentation

## 2021-06-07 DIAGNOSIS — I10 Essential (primary) hypertension: Secondary | ICD-10-CM | POA: Diagnosis present

## 2021-06-07 DIAGNOSIS — R0602 Shortness of breath: Secondary | ICD-10-CM

## 2021-06-07 DIAGNOSIS — I5032 Chronic diastolic (congestive) heart failure: Secondary | ICD-10-CM | POA: Diagnosis not present

## 2021-06-07 DIAGNOSIS — I4891 Unspecified atrial fibrillation: Secondary | ICD-10-CM | POA: Diagnosis not present

## 2021-06-07 DIAGNOSIS — I4821 Permanent atrial fibrillation: Principal | ICD-10-CM | POA: Insufficient documentation

## 2021-06-07 DIAGNOSIS — E785 Hyperlipidemia, unspecified: Secondary | ICD-10-CM | POA: Diagnosis present

## 2021-06-07 LAB — URINALYSIS, COMPLETE (UACMP) WITH MICROSCOPIC
Bacteria, UA: NONE SEEN
Bilirubin Urine: NEGATIVE
Glucose, UA: NEGATIVE mg/dL
Hgb urine dipstick: NEGATIVE
Ketones, ur: NEGATIVE mg/dL
Leukocytes,Ua: NEGATIVE
Nitrite: NEGATIVE
Protein, ur: NEGATIVE mg/dL
Specific Gravity, Urine: 1.005 (ref 1.005–1.030)
pH: 7 (ref 5.0–8.0)

## 2021-06-07 LAB — CBC
HCT: 37 % (ref 36.0–46.0)
Hemoglobin: 13.4 g/dL (ref 12.0–15.0)
MCH: 31.6 pg (ref 26.0–34.0)
MCHC: 36.2 g/dL — ABNORMAL HIGH (ref 30.0–36.0)
MCV: 87.3 fL (ref 80.0–100.0)
Platelets: 199 10*3/uL (ref 150–400)
RBC: 4.24 MIL/uL (ref 3.87–5.11)
RDW: 14.8 % (ref 11.5–15.5)
WBC: 6.6 10*3/uL (ref 4.0–10.5)
nRBC: 0 % (ref 0.0–0.2)

## 2021-06-07 LAB — RESP PANEL BY RT-PCR (FLU A&B, COVID) ARPGX2
Influenza A by PCR: NEGATIVE
Influenza B by PCR: NEGATIVE
SARS Coronavirus 2 by RT PCR: NEGATIVE

## 2021-06-07 LAB — COMPREHENSIVE METABOLIC PANEL
ALT: 21 U/L (ref 0–44)
AST: 34 U/L (ref 15–41)
Albumin: 3.4 g/dL — ABNORMAL LOW (ref 3.5–5.0)
Alkaline Phosphatase: 110 U/L (ref 38–126)
Anion gap: 6 (ref 5–15)
BUN: 15 mg/dL (ref 8–23)
CO2: 29 mmol/L (ref 22–32)
Calcium: 9.2 mg/dL (ref 8.9–10.3)
Chloride: 106 mmol/L (ref 98–111)
Creatinine, Ser: 0.95 mg/dL (ref 0.44–1.00)
GFR, Estimated: 58 mL/min — ABNORMAL LOW (ref >60)
Glucose, Bld: 84 mg/dL (ref 70–99)
Potassium: 4.3 mmol/L (ref 3.5–5.1)
Sodium: 141 mmol/L (ref 135–145)
Total Bilirubin: 1 mg/dL (ref 0.3–1.2)
Total Protein: 7.3 g/dL (ref 6.5–8.1)

## 2021-06-07 LAB — PROCALCITONIN: Procalcitonin: <0.1 ng/mL

## 2021-06-07 LAB — TSH: TSH: 1.012 u[IU]/mL (ref 0.350–4.500)

## 2021-06-07 LAB — TROPONIN I (HIGH SENSITIVITY)
Troponin I (High Sensitivity): 14 ng/L (ref <18)
Troponin I (High Sensitivity): 17 ng/L (ref <18)

## 2021-06-07 LAB — BRAIN NATRIURETIC PEPTIDE: B Natriuretic Peptide: 174.9 pg/mL — ABNORMAL HIGH (ref 0.0–100.0)

## 2021-06-07 MED ORDER — MEMANTINE HCL 5 MG PO TABS
5.0000 mg | ORAL_TABLET | Freq: Once | ORAL | Status: AC
  Administered 2021-06-07: 5 mg via ORAL
  Filled 2021-06-07: qty 1

## 2021-06-07 MED ORDER — ATORVASTATIN CALCIUM 20 MG PO TABS
10.0000 mg | ORAL_TABLET | Freq: Every day | ORAL | Status: DC
  Administered 2021-06-08: 10 mg via ORAL
  Filled 2021-06-07 (×2): qty 1

## 2021-06-07 MED ORDER — ACETAMINOPHEN 325 MG PO TABS: 650.0000 mg | ORAL_TABLET | Freq: Four times a day (QID) | ORAL | Status: DC | PRN

## 2021-06-07 MED ORDER — DONEPEZIL HCL 5 MG PO TABS
10.0000 mg | ORAL_TABLET | Freq: Once | ORAL | Status: AC
  Administered 2021-06-07: 10 mg via ORAL
  Filled 2021-06-07: qty 2

## 2021-06-07 MED ORDER — ONDANSETRON HCL 4 MG/2ML IJ SOLN: 4.0000 mg | Freq: Four times a day (QID) | INTRAMUSCULAR | Status: DC | PRN

## 2021-06-07 MED ORDER — LISINOPRIL 10 MG PO TABS
20.0000 mg | ORAL_TABLET | Freq: Every day | ORAL | Status: DC
  Administered 2021-06-08: 20 mg via ORAL
  Filled 2021-06-07 (×2): qty 2

## 2021-06-07 MED ORDER — GABAPENTIN 100 MG PO CAPS
100.0000 mg | ORAL_CAPSULE | Freq: Once | ORAL | Status: AC
  Administered 2021-06-07: 100 mg via ORAL
  Filled 2021-06-07: qty 1

## 2021-06-07 MED ORDER — SERTRALINE HCL 50 MG PO TABS
50.0000 mg | ORAL_TABLET | Freq: Every day | ORAL | Status: DC
  Filled 2021-06-07: qty 1

## 2021-06-07 MED ORDER — APIXABAN 5 MG PO TABS
5.0000 mg | ORAL_TABLET | Freq: Once | ORAL | Status: AC
  Administered 2021-06-07: 5 mg via ORAL
  Filled 2021-06-07: qty 1

## 2021-06-07 MED ORDER — ACETAMINOPHEN 325 MG RE SUPP: 650.0000 mg | Freq: Four times a day (QID) | RECTAL | Status: DC | PRN

## 2021-06-07 MED ORDER — ONDANSETRON HCL 4 MG PO TABS: 4.0000 mg | ORAL_TABLET | Freq: Four times a day (QID) | ORAL | Status: DC | PRN
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
## 2021-06-07 NOTE — ED Notes (Signed)
Pt ambulated to BR with steady gait.

## 2021-06-07 NOTE — Hospital Course (Signed)
[name] is a 88 year old female with dementia, neuropathy, hypertension, hyperlipidemia, atrial fibrillation, currently on Eliquis who presents emergency department from home for chief concerns of dizziness and palpitations. ? ?Initial vitals in the emergency department showed temperature of 98.2, respiration rate of 18, heart rate of 101, blood pressure 173/102, SPO2 100% on room air. ? ?Serum sodium 141, potassium 4.3, chloride 106, bicarb 29, BUN of 15, serum creatinine of 0.95, nonfasting blood glucose 84, GFR 58, WBC 6.6, hemoglobin 13.4, platelets 199. ? ?High-sensitivity troponin was 14 and this repeat was 17. ? ?ED treatment: Patient was resumed on apixaban 5 mg p.o., donepezil 10 mg, gabapentin 100 mg, memantine 5 mg.  Patient was pending discharge however family requesting patient to be evaluated due to concerns of A-fib. ? ?EDP called cardiology who recommended admission under hospitalist service for telemetry monitoring. ?

## 2021-06-07 NOTE — ED Notes (Signed)
Spoke with Tasha in lab about urine sent down on pt earlier today before order for urine was entered.  Per Rodney Booze, she will look for it and process it now. ?

## 2021-06-07 NOTE — ED Notes (Signed)
Pt family at bedside concerned about pt getting her 9pm meds if not d/c by then.  Meds as follows: Eliquis 5mg , gabapentin 100mg , donepezil 10mg , memantine 5mg .  EDP [place] notified. ?

## 2021-06-07 NOTE — ED Notes (Signed)
Pt denies, pain, dizziness, nausea at this time.  Pt states that she feels at her baseline. ?

## 2021-06-07 NOTE — ED Provider Notes (Addendum)
? ? ?[facility] ?Emergency Department Provider Note ? ? ? ? Event Date/Time  ? First MD Initiated Contact with Patient 06/07/21 1403   ?  (approximate) ? ? ?History  ? ?Dizziness and Palpitations ? ? ?HPI ? ?Briana Howe is a 88 y.o. female with a history recently diagnosed of A-fib on Eliquis, PACs, morbid obesity, hypertension, and late onset Alzheimer's presents to the ED from home, via EMS.  Patient reports intermittent episodes of dizziness and palpitations started after she was doing some housework and chores.  She denies any syncope, diaphoresis, or chest pain.  She does endorse some mild shortness of breath. She notes she has taken her home meds as scheduled. She is not on any rate-controlling medicines according to chart review.  EMS reports heart rates in the 120s to 130s in the field.  No meds were given in route.  Patient presents to the ED and found to have heart rates mildly tachycardic in the 100-110s, with evidence of A-fib/a-flutter. ? ? ?Physical Exam  ? ?Triage Vital Signs: ?ED Triage Vitals [06/07/21 1405]  ?Enc Vitals Group  ?   BP   ?   Pulse   ?   Resp   ?   Temp   ?   Temp src   ?   SpO2   ?   Weight 240 lb (108.9 kg)  ?   Height 5\' 6"  (1.676 m)  ?   Head Circumference   ?   Peak Flow   ?   Pain Score   ?   Pain Loc   ?   Pain Edu?   ?   Excl. in GC?   ? ? ?Most recent vital signs: ?Vitals:  ? 06/07/21 1930 06/07/21 2030  ?BP: (!) 156/79 (!) 181/88  ?Pulse: 62 62  ?Resp: 16 15  ?Temp:    ?SpO2: 100% 100%  ? ? ?General Awake, no distress. Active and engaged ?CV:  Good peripheral perfusion. RRR ?RESP:  Normal effort. CTA ?ABD:  No distention. soft ? ? ?ED Results / Procedures / Treatments  ? ?Labs ?(all labs ordered are listed, but only abnormal results are displayed) ?Labs Reviewed  ?CBC - Abnormal; Notable for the following components:  ?    Result Value  ? MCHC 36.2 (*)   ? All other components within normal limits  ?COMPREHENSIVE METABOLIC PANEL -  Abnormal; Notable for the following components:  ? Albumin 3.4 (*)   ? GFR, Estimated 58 (*)   ? All other components within normal limits  ?TROPONIN I (HIGH SENSITIVITY)  ?TROPONIN I (HIGH SENSITIVITY)  ? ? ? ?EKG ? ?Vent. rate 91 BPM ?PR interval * ms ?QRS duration 77 ms ?QT/QTcB 377/464 ms ?P-R-T axes * 62 -46 ?No STEMI ? ? ?RADIOLOGY ? ?No results found. ? ? ?PROCEDURES: ? ?Critical Care performed: No ? ?Procedures ? ? ?MEDICATIONS ORDERED IN ED: ?Medications  ?apixaban (ELIQUIS) tablet 5 mg (5 mg Oral Given 06/07/21 2057)  ?gabapentin (NEURONTIN) capsule 100 mg (100 mg Oral Given 06/07/21 2057)  ?donepezil (ARICEPT) tablet 10 mg (10 mg Oral Given 06/07/21 2057)  ?memantine [facility]) tablet 5 mg (5 mg Oral Given 06/07/21 2057)  ? ? ? ?IMPRESSION / MDM / ASSESSMENT AND PLAN / ED COURSE  ?I reviewed the triage vital signs and the nursing notes. ?             ?               ? ?  Differential diagnosis includes, but is not limited to, ACS, aortic dissection, pulmonary embolism, cardiac tamponade, pneumothorax, pneumonia, pericarditis, myocarditis, GI-related causes including esophagitis/gastritis, and musculoskeletal chest wall pain.   ? ?The patient is on the cardiac monitor to evaluate for evidence of arrhythmia and/or significant heart rate changes.  Patient is in what appears to be A-flutter has now normalized to a variable rhythm between 59-71 bpm.  ? ?----------------------------------------- ?8:37 PM on 06/07/2021 ?----------------------------------------- ?Several unsuccessful attempts to reach on-call cardiologist for the [facility].  ?Finally got secure chat message reporting no pages had been received. S/W Dr. Duke Salvia and she recommends admission for observation. Cardiology service will see patient in consult tomorrow. Patient/family agreeable to plan of care.  ? ?Patient's diagnosis is consistent with variable atrial fibrillation on presentation.  Patient's been stable throughout her course in the ED.  Her heart  rate is stabilized with sinus rhythm.  Normal troponin x2 and no other complaints of chest pain or shortness of breath throughout ED course.  Heart rates appear to be borderline bradycardic at this time.  Patient's blood pressure has been hypertensive in the 160-190s systolic throughout her ED course.  ? ?Clinical Course as of 06/07/21 2105  ?Sun Jun 07, 2021  ?1743 Pulse Rate: 62 [JM]  ?  ?Clinical Course User Index ?[JM] Arran Fessel, Charlesetta Ivory, PA-C  ? ? ? ?FINAL CLINICAL IMPRESSION(S) / ED DIAGNOSES  ? ?Final diagnoses:  ?Atrial fibrillation, unspecified type (HCC)  ? ? ? ?Rx / DC Orders  ? ?ED Discharge Orders   ? ? None  ? ?  ? ? ? ?Note:  This document was prepared using Dragon voice recognition software and may include unintentional dictation errors. ? ?  ?Lissa Hoard, PA-C ?06/07/21 2105 ? ?  ?Lissa Hoard, PA-C ?06/07/21 2105 ? ?  ?Concha Se, MD ?06/07/21 2108 ? ?

## 2021-06-07 NOTE — ED Triage Notes (Signed)
Pt ems from home for dizziness and palpitations that started after housework. Per EMS HR 120's to 130's A-fib. Pt has hx of a-fib.  Pt a/o.  ?

## 2021-06-07 NOTE — ED Notes (Signed)
Pt family in room at this time.  Pt and family requesting sertraline to be D/C, pt does not take this medication at home.  Also requesting morning meds be scheduled at 0900 instead of 1000 to avoid confusion w/administration. I did explain to family that the medication can be admin at 0900 even if scheduled for 1000.  I can message pharmacy to reschedule but sertraline will need to be d/c. IPMD Cox and NP [place] notified. ?

## 2021-06-08 ENCOUNTER — Telehealth: Payer: Self-pay | Admitting: *Deleted

## 2021-06-08 ENCOUNTER — Ambulatory Visit (INDEPENDENT_AMBULATORY_CARE_PROVIDER_SITE_OTHER): Payer: Medicare Other

## 2021-06-08 ENCOUNTER — Observation Stay (HOSPITAL_BASED_OUTPATIENT_CLINIC_OR_DEPARTMENT_OTHER)
Admit: 2021-06-08 | Discharge: 2021-06-08 | Disposition: A | Discharge status: Home / Self Care | Payer: Medicare Other | Attending: Internal Medicine | Admitting: Internal Medicine

## 2021-06-08 DIAGNOSIS — I480 Paroxysmal atrial fibrillation: Secondary | ICD-10-CM

## 2021-06-08 DIAGNOSIS — I10 Essential (primary) hypertension: Secondary | ICD-10-CM

## 2021-06-08 DIAGNOSIS — I4891 Unspecified atrial fibrillation: Secondary | ICD-10-CM

## 2021-06-08 DIAGNOSIS — R0609 Other forms of dyspnea: Secondary | ICD-10-CM

## 2021-06-08 DIAGNOSIS — I5032 Chronic diastolic (congestive) heart failure: Secondary | ICD-10-CM

## 2021-06-08 DIAGNOSIS — I4821 Permanent atrial fibrillation: Secondary | ICD-10-CM | POA: Diagnosis not present

## 2021-06-08 DIAGNOSIS — E782 Mixed hyperlipidemia: Secondary | ICD-10-CM

## 2021-06-08 LAB — CBC
HCT: 34.6 % — ABNORMAL LOW (ref 36.0–46.0)
Hemoglobin: 12.3 g/dL (ref 12.0–15.0)
MCH: 31.1 pg (ref 26.0–34.0)
MCHC: 35.5 g/dL (ref 30.0–36.0)
MCV: 87.6 fL (ref 80.0–100.0)
Platelets: 189 10*3/uL (ref 150–400)
RBC: 3.95 MIL/uL (ref 3.87–5.11)
RDW: 14.4 % (ref 11.5–15.5)
WBC: 5.4 10*3/uL (ref 4.0–10.5)
nRBC: 0 % (ref 0.0–0.2)

## 2021-06-08 LAB — BASIC METABOLIC PANEL
Anion gap: 7 (ref 5–15)
BUN: 12 mg/dL (ref 8–23)
CO2: 29 mmol/L (ref 22–32)
Calcium: 9 mg/dL (ref 8.9–10.3)
Chloride: 105 mmol/L (ref 98–111)
Creatinine, Ser: 0.92 mg/dL (ref 0.44–1.00)
GFR, Estimated: >60 mL/min (ref >60)
Glucose, Bld: 85 mg/dL (ref 70–99)
Potassium: 3.9 mmol/L (ref 3.5–5.1)
Sodium: 141 mmol/L (ref 135–145)

## 2021-06-08 LAB — ECHOCARDIOGRAM COMPLETE
AR max vel: 1.69 cm2
AV Area VTI: 1.64 cm2
AV Area mean vel: 1.61 cm2
AV Mean grad: 4 mmHg
AV Peak grad: 6.5 mmHg
Ao pk vel: 1.27 m/s
Area-P 1/2: 4.89 cm2
Height: 66 in
MV VTI: 1.66 cm2
S' Lateral: 2.22 cm
Weight: 3840 oz

## 2021-06-08 MED ORDER — APIXABAN 5 MG PO TABS: 5.0000 mg | ORAL_TABLET | Freq: Two times a day (BID) | ORAL | Status: DC

## 2021-06-08 MED ORDER — DONEPEZIL HCL 5 MG PO TABS: 10.0000 mg | ORAL_TABLET | Freq: Every day | ORAL | Status: DC

## 2021-06-08 MED ORDER — OMEGA-3-ACID ETHYL ESTERS 1 G PO CAPS
1000.0000 mg | ORAL_CAPSULE | Freq: Every day | ORAL | Status: DC
  Administered 2021-06-08: 1000 mg via ORAL
  Filled 2021-06-08 (×2): qty 1

## 2021-06-08 MED ORDER — OYSTER SHELL CALCIUM/D3 500-5 MG-MCG PO TABS
1.0000 | ORAL_TABLET | Freq: Every day | ORAL | Status: DC
  Administered 2021-06-08: 1 via ORAL
  Filled 2021-06-08: qty 1

## 2021-06-08 MED ORDER — HYDRALAZINE HCL 10 MG PO TABS
10.0000 mg | ORAL_TABLET | Freq: Four times a day (QID) | ORAL | Status: DC | PRN
  Filled 2021-06-08: qty 1

## 2021-06-08 MED ORDER — GABAPENTIN 100 MG PO CAPS
100.0000 mg | ORAL_CAPSULE | Freq: Two times a day (BID) | ORAL | Status: DC
  Administered 2021-06-08: 100 mg via ORAL
  Filled 2021-06-08: qty 1

## 2021-06-08 MED ORDER — MEMANTINE HCL 5 MG PO TABS
5.0000 mg | ORAL_TABLET | Freq: Two times a day (BID) | ORAL | Status: DC
  Administered 2021-06-08: 5 mg via ORAL
  Filled 2021-06-08: qty 1

## 2021-06-08 MED ORDER — APIXABAN 5 MG PO TABS
5.0000 mg | ORAL_TABLET | Freq: Two times a day (BID) | ORAL | Status: DC
  Administered 2021-06-08: 5 mg via ORAL
  Filled 2021-06-08: qty 1

## 2021-06-08 MED ORDER — ADULT MULTIVITAMIN W/MINERALS CH
1.0000 | ORAL_TABLET | Freq: Every day | ORAL | Status: DC
  Administered 2021-06-08: 1 via ORAL
  Filled 2021-06-08: qty 1

## 2021-06-08 NOTE — Assessment & Plan Note (Addendum)
-   Etiology work-up in progress ?- Check procalcitonin, TSH, complete echo, UA ?- Cardiologist, Dr. Oval Linsey has been consulted by EDP and recommends that patient be admitted to medicine service, and cardiology will evaluate in the a.m. ?- Telemetry cardiac, observation ?

## 2021-06-08 NOTE — Assessment & Plan Note (Signed)
-   Resumed home lisinopril 20 mg daily ?- Hydralazine 10 mg every 6 hours as needed for SBP greater than 175, 2 days with ?

## 2021-06-08 NOTE — Progress Notes (Signed)
*  PRELIMINARY RESULTS* ?Echocardiogram ?2D Echocardiogram has been performed. ? ?Briana Howe Briana Howe ?06/08/2021, 9:40 AM ?

## 2021-06-08 NOTE — Assessment & Plan Note (Signed)
-   Resumed home donepezil 10 mg nightly ?- Memantine 5 mg p.o. twice daily ?

## 2021-06-08 NOTE — Telephone Encounter (Signed)
-----   Message from Sondra Barges, PA-C sent at 06/08/2021  1:41 PM EDT ----- ?Please schedule follow up for after Zio patch. Thanks! ? ?

## 2021-06-08 NOTE — Telephone Encounter (Signed)
-----   Message from Rise Mu, PA-C sent at 06/08/2021  1:29 PM EDT ----- ?Please mail a Zio AT. Dx: Afib with dizziness ? ?

## 2021-06-08 NOTE — ED Notes (Addendum)
This RN to ambulate pt in hallway per cardiology request. Pt visitor at bedside.  Pt reports normally uses Rolator walker at home, Pt ambulatory; slow, but steady. c/o back pain ; 98 HR average with Ambulation, o2 100%RA, Cardiology to bedside as pt entering back to room . ?
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
## 2021-06-08 NOTE — ED Notes (Signed)
Orthostatics completed and pt passed without any complains of dizziness. Pt also denies any palpitations at this time.  ?

## 2021-06-08 NOTE — ED Notes (Signed)
Pt resting comfortably at this time. NAD noted. Echo at bedside. Family at bedside. Pt denies any needs at this time. NAD noted.  ? ?Breakfast tray given to pt. Call bell in reach.  ?

## 2021-06-08 NOTE — Discharge Instructions (Signed)

## 2021-06-08 NOTE — Assessment & Plan Note (Signed)
-   Atorvastatin 10 mg daily resumed 

## 2021-06-08 NOTE — ED Notes (Signed)
Pt d/c home per MD order. Discharge summary reviewed with pt and visitor, verbalize understanding.Off unit via WC- no s/s of acute distress noted at discharge.Pt discharged home with visitor.  ?

## 2021-06-08 NOTE — ED Notes (Signed)
This RN went to administer 0900 medications and daughter requests that this RN comes back "and gives all the medications at once".  ? ?MD made aware of - orthostatics.  ?

## 2021-06-08 NOTE — ED Notes (Signed)
This RN assisted pt to in-room toilet.  Pt steady on feet.  Pt ambulated back to bed and hooked back to monitor at this time.  Encouraged to let this RN know of any needs.  Daughter at bedside. ?

## 2021-06-08 NOTE — Discharge Summary (Signed)
Physician Discharge Summary  Briana Howe [id number] DOB: [birth year]  PCP: Kirk Ruths, MD  Admitted from: Home Discharged to: Home  Admit date: 06/07/2021 Discharge date: 06/08/2021  Recommendations for Outpatient Follow-up:    Follow-up Information     Kirk Ruths, MD. Schedule an appointment as soon as possible for a visit.   Specialty: Internal Medicine Contact information: [place] [address] [phone]         Nelva Bush, MD Follow up.   Specialty: Cardiology Why: MD's office will aarrange an out patient heart monitor and office visit.  Please contact them if you do not hear from them in 2-3 business days. Contact information: [place] [address] [phone]                  Home Health: None    Equipment/Devices: None    Discharge Condition: Improved and stable   Code Status: Full Code Diet recommendation:  Discharge Diet Orders (From admission, onward)     Start     Ordered   06/08/21 0000  Diet - low sodium heart healthy        06/08/21 1359             Discharge Diagnoses:  Principal Problem:   Atrial fibrillation with RVR ([place]) Active Problems:   Hyperlipidemia   Morbid obesity ([place])   Essential hypertension   Late onset Alzheimer's dementia with behavioral disturbance [facility])   Brief Summary: 88 year old female, lives with her adult daughter, independent at home and occasionally uses a cane when she goes outside, medical history significant for permanent atrial fibrillation on anticoagulation with Eliquis but no rate control medications due to underlying bradycardia, chronic diastolic CHF, hypertension, hyperlipidemia, obesity, chronic lower extremity swelling, obesity, presented to the ED on 06/07/2021 with complaints of dizziness and palpitations.  Cardiology was consulted.  Assessment and plan:  1.  Dizziness  and palpitations: Unclear etiology.  Daughter at bedside reported that pulse rate at home was 132 and BP 182/120.  However in the hospital on telemetry, she had atrial flutter with controlled ventricular rate both at rest and with ambulation.  Orthostatic vital signs were negative.  No focal neurological deficits.  She reportedly developed sudden onset of palpitations and dizziness while walking around her house and as per cardiology, she had similar symptoms in 12/21 when she was diagnosed with A-fib.  Work-up: Admission EKG showed atrial flutter with controlled and variable AV block.  Subsequent EKG showed atrial flutter with predominantly 4:1 AV block and a heart rate of 63/min. HS opponents negative, 14 > 17, BNP minimally elevated at 175, TSH normal/1.012, 2D echo showed LVEF of 60-65%, no regional wall motion abnormalities, grade 2 diastolic dysfunction and no aortic stenosis.  Infectious work-up including influenza and SARS coronavirus 2 RT-PCR, urine microscopy, chest x-ray and procalcitonin were negative.  Cardiology was consulted.  They noted that patient seemed to have significant fluctuation in heart rate correlating with her activities and back pain which worsened recently.  Her ventricular rate is controlled without medications and at times does become bradycardic but not below 50/min.  They recommended an outpatient 2 weeks ZIO monitor to capture any possible arrhythmias correlating to her symptoms and we will arrange outpatient cardiology follow-up.  If she does have tachyarrhythmias, she may require a permanent pacemaker so that she can be placed on rate control medications to treat her A-fib.  Cardiology  cleared her for discharge home.  Patient's daughter had concerns regarding premature discharge but advised her that at this time extensive work-up has been negative and patient is stable for discharge with close outpatient follow-up with above evaluation and there is no medical necessity for  continued hospitalization.  Patient and daughter verbalized understanding, were comfortable then returning home.  Also on ambulation with RN, heart rate at 98/min. 2.  Permanent atrial fibrillation/new onset atrial flutter: Management as above.  No changes made to home medications.  Continue PTA Eliquis (CHA2DS2-VASc score: At least 5).  No rate control medications due to bradycardia at rest. 3.  Chronic diastolic CHF: Compensated except for mild bilateral leg edema which appears to be chronic and may be due to stasis.  Continue prior home dose of Lasix and potassium supplements.  Counseled patient and daughter at bedside regarding use of lower extremity compression stockings. 4.  Essential hypertension: At home reportedly had SBP in the 180s prior to arrival.  Mildly uncontrolled and fluctuating.  Continue prior home dose of lisinopril.  Outpatient follow-up. 5.  Hyperlipidemia: Continue PTA atorvastatin and omega-3. 6.  Dementia: Does not appear to have any behavioral health issues.  Continue Aricept and Namenda. 7.  Chronic back pain: Continue gabapentin.     Consultations: Cardiology  Procedures: None   Discharge Instructions  Discharge Instructions     (HEART FAILURE PATIENTS) Call MD:  Anytime you have any of the following symptoms: 1) 3 pound weight gain in 24 hours or 5 pounds in 1 week 2) shortness of breath, with or without a dry hacking cough 3) swelling in the hands, feet or stomach 4) if you have to sleep on extra pillows at night in order to breathe.   Complete by: As directed    Call MD for:   Complete by: As directed    Recurrent heart racing or palpitations.   Call MD for:  difficulty breathing, headache or visual disturbances   Complete by: As directed    Call MD for:  extreme fatigue   Complete by: As directed    Call MD for:  persistant dizziness or light-headedness   Complete by: As directed    Call MD for:  severe uncontrolled pain   Complete by: As directed     Diet - low sodium heart healthy   Complete by: As directed    Increase activity slowly   Complete by: As directed         Medication List     TAKE these medications    atorvastatin 10 MG tablet Commonly known as: LIPITOR Take 10 mg by mouth daily.   Calcium + D3 600-800 MG-UNIT Tabs Take 1 tablet by mouth daily.   donepezil 10 MG tablet Commonly known as: ARICEPT TAKE 1 TABLET(10 MG) BY MOUTH AT BEDTIME   Eliquis 5 MG Tabs tablet Generic drug: apixaban TAKE 1 TABLET(5 MG) BY MOUTH TWICE DAILY   furosemide 20 MG tablet Commonly known as: LASIX Take 1 tablet (20 mg total) by mouth every other day. And as needed for 3 lbs weight gain   gabapentin 100 MG capsule Commonly known as: NEURONTIN Take 100 mg by mouth 2 (two) times daily.   ketotifen 0.025 % ophthalmic solution Commonly known as: ZADITOR 1 drop 2 (two) times daily. Thera Tears   lisinopril 20 MG tablet Commonly known as: ZESTRIL Take 1 tablet (20 mg total) by mouth daily.   memantine 5 MG tablet Commonly known as: NAMENDA Take 1 tablet (  5 mg total) by mouth 2 (two) times daily.   Multi-Vitamins Tabs Take 1 tablet by mouth daily.   Omega-3 Fish Oil 1200 MG Caps Take 1,200 mg by mouth daily.   potassium chloride 10 MEQ tablet Commonly known as: KLOR-CON Take 10 mEq by mouth every other day.   sertraline 50 MG tablet Commonly known as: Zoloft Take 1 tablet (50 mg total) by mouth at bedtime.       Allergies  Allergen Reactions   Shellfish Allergy     Unable to walk      Procedures/Studies: DG Chest Port 1 View  Result Date: 06/07/2021 CLINICAL DATA:  Fibrillation with rapid ventricular response, dizziness, palpitations, hypertension EXAM: PORTABLE CHEST 1 VIEW COMPARISON:  Portable exam 2136 hours compared to 03/01/2020 FINDINGS: Borderline enlargement of cardiac silhouette. Mediastinal contours and pulmonary vascularity normal. Minimal bibasilar atelectasis. Lungs otherwise clear. No  infiltrate, pleural effusion, or pneumothorax. IMPRESSION: Minimal bibasilar atelectasis. Electronically Signed   By: Ulyses Southward M.D.   On: 06/07/2021 21:46   ECHOCARDIOGRAM COMPLETE  Result Date: 06/08/2021    ECHOCARDIOGRAM REPORT   Patient Name:   KHALIS LASANE [facility] Date of Exam: 06/08/2021 Medical Rec #:  [id number]                  Height:       66.0 in Accession #:    [id number]                 Weight:       240.0 lb Date of Birth:  [birth year]                  BSA:          2.161 m Patient Age:    89 years                   BP:           146/76 mmHg Patient Gender: F                          HR:           61 bpm. Exam Location:  [facility] Procedure: 2D Echo, Color Doppler and Cardiac Doppler Indications:     R06.00 Dyspnea; I48.91 Atrial fibrillation  History:         Patient has prior history of Echocardiogram examinations.                  Signs/Symptoms:Dizziness/Lightheadedness and Palpitations; Risk                  Factors:Hypertension and Dyslipidemia.  Sonographer:     Humphrey Rolls Referring Phys:  [id number] AMY N COX Diagnosing Phys: Lorine Bears MD  Sonographer Comments: Suboptimal parasternal window and no subcostal window. IMPRESSIONS  1. Left ventricular ejection fraction, by estimation, is 60 to 65%. The left ventricle has normal function. The left ventricle has no regional wall motion abnormalities. Left ventricular diastolic parameters are consistent with Grade II diastolic dysfunction (pseudonormalization).  2. Right ventricular systolic function is normal. The right ventricular size is normal.  3. Left atrial size was mildly dilated.  4. The mitral valve is normal in structure. Mild to moderate mitral valve regurgitation. No evidence of mitral stenosis.  5. The aortic valve is normal in structure. Aortic valve regurgitation is not visualized. Aortic valve sclerosis/calcification is present, without any evidence of aortic stenosis. Aortic valve mean gradient measures  4.0 mmHg. FINDINGS   Left Ventricle: Left ventricular ejection fraction, by estimation, is 60 to 65%. The left ventricle has normal function. The left ventricle has no regional wall motion abnormalities. The left ventricular internal cavity size was normal in size. There is  no left ventricular hypertrophy. Left ventricular diastolic parameters are consistent with Grade II diastolic dysfunction (pseudonormalization). Right Ventricle: The right ventricular size is normal. No increase in right ventricular wall thickness. Right ventricular systolic function is normal. Left Atrium: Left atrial size was mildly dilated. Right Atrium: Right atrial size was normal in size. Pericardium: There is no evidence of pericardial effusion. Mitral Valve: The mitral valve is normal in structure. Mild to moderate mitral valve regurgitation. No evidence of mitral valve stenosis. MV peak gradient, 5.9 mmHg. The mean mitral valve gradient is 2.0 mmHg. Tricuspid Valve: The tricuspid valve is normal in structure. Tricuspid valve regurgitation is mild . No evidence of tricuspid stenosis. Aortic Valve: The aortic valve is normal in structure. Aortic valve regurgitation is not visualized. Aortic valve sclerosis/calcification is present, without any evidence of aortic stenosis. Aortic valve mean gradient measures 4.0 mmHg. Aortic valve peak  gradient measures 6.5 mmHg. Aortic valve area, by VTI measures 1.64 cm. Pulmonic Valve: The pulmonic valve was normal in structure. Pulmonic valve regurgitation is not visualized. No evidence of pulmonic stenosis. Aorta: The aortic root is normal in size and structure. Venous: The inferior vena cava was not well visualized. IAS/Shunts: No atrial level shunt detected by color flow Doppler.  LEFT VENTRICLE PLAX 2D LVIDd:         3.70 cm   Diastology LVIDs:         2.22 cm   LV e' medial:    5.22 cm/s LV PW:         0.98 cm   LV E/e' medial:  18.7 LV IVS:        0.72 cm   LV e' lateral:   7.51 cm/s LVOT diam:     2.00 cm   LV  E/e' lateral: 13.0 LV SV:         46 LV SV Index:   21 LVOT Area:     3.14 cm  LEFT ATRIUM             Index LA diam:        4.60 cm 2.13 cm/m LA Vol (A2C):   58.7 ml 27.16 ml/m LA Vol (A4C):   68.7 ml 31.79 ml/m LA Biplane Vol: 64.9 ml 30.03 ml/m  AORTIC VALVE                    PULMONIC VALVE AV Area (Vmax):    1.69 cm     PV Vmax:       0.59 m/s AV Area (Vmean):   1.61 cm     PV Vmean:      40.700 cm/s AV Area (VTI):     1.64 cm     PV VTI:        0.096 m AV Vmax:           127.00 cm/s  PV Peak grad:  1.4 mmHg AV Vmean:          88.400 cm/s  PV Mean grad:  1.0 mmHg AV VTI:            0.280 m AV Peak Grad:      6.5 mmHg AV Mean Grad:      4.0 mmHg LVOT Vmax:  68.50 cm/s LVOT Vmean:        45.300 cm/s LVOT VTI:          0.146 m LVOT/AV VTI ratio: 0.52  AORTA Ao Root diam: 3.00 cm MITRAL VALVE               TRICUSPID VALVE MV Area (PHT): 4.89 cm    TR Peak grad:   35.3 mmHg MV Area VTI:   1.66 cm    TR Vmax:        297.00 cm/s MV Peak grad:  5.9 mmHg MV Mean grad:  2.0 mmHg    SHUNTS MV Vmax:       1.21 m/s    Systemic VTI:  0.15 m MV Vmean:      62.9 cm/s   Systemic Diam: 2.00 cm MV Decel Time: 155 msec MV E velocity: 97.70 cm/s MV A velocity: 64.70 cm/s MV E/A ratio:  1.51 Kathlyn Sacramento MD Electronically signed by Kathlyn Sacramento MD Signature Date/Time: 06/08/2021/12:32:16 PM    Final       Subjective: Patient first seen early this morning and subsequently this afternoon at time of discharge.  She denies dizziness, lightheadedness, palpitations, chest pain or dyspnea.  Discharge Exam:  Vitals:   06/08/21 0800 06/08/21 1028 06/08/21 1130 06/08/21 1417  BP: (!) 146/76 (!) 169/79 (!) 172/79 (!) 159/98  Pulse: (!) 58  (!) 58 68  Resp: 18  14 16   Temp:    98.4 F (36.9 C)  TempSrc:    Oral  SpO2: 98%  100% 100%  Weight:      Height:        General: Elderly female, moderately built and obese sitting up comfortably in bed without distress. Cardiovascular: S1 & S2 heard, irregularly  irregular, S1/S2 +. No murmurs, rubs, gallops or clicks.  No JVD.  1+ pitting bilateral leg edema up to mid shin.  Telemetry personally reviewed: Atrial flutter with controlled ventricular rate. Respiratory: Clear to auscultation without wheezing, rhonchi or crackles. No increased work of breathing. Abdominal:  Non distended, non tender & soft. No organomegaly or masses appreciated. Normal bowel sounds heard. CNS: Alert and oriented. No focal deficits. Extremities: Symmetric 5 x 5 power    The results of significant diagnostics from this hospitalization (including imaging, microbiology, ancillary and laboratory) are listed below for reference.     Microbiology: Recent Results (from the past 240 hour(s))  Resp Panel by RT-PCR (Flu A&B, Covid) Nasopharyngeal Swab     Status: None   Collection Time: 06/07/21 10:23 PM   Specimen: Nasopharyngeal Swab; Nasopharyngeal(NP) swabs in vial transport medium  Result Value Ref Range Status   SARS Coronavirus 2 by RT PCR NEGATIVE NEGATIVE Final    Comment: (NOTE) SARS-CoV-2 target nucleic acids are NOT DETECTED.  The SARS-CoV-2 RNA is generally detectable in upper respiratory specimens during the acute phase of infection. The lowest concentration of SARS-CoV-2 viral copies this assay can detect is 138 copies/mL. A negative result does not preclude SARS-Cov-2 infection and should not be used as the sole basis for treatment or other patient management decisions. A negative result may occur with  improper specimen collection/handling, submission of specimen other than nasopharyngeal swab, presence of viral mutation(s) within the areas targeted by this assay, and inadequate number of viral copies(<138 copies/mL). A negative result must be combined with clinical observations, patient history, and epidemiological information. The expected result is Negative.  Fact Sheet for Patients:  EntrepreneurPulse.com.au  Fact Sheet for  Healthcare Providers:  IncredibleEmployment.be  This test is no t yet approved or cleared by the [place] and  has been authorized for detection and/or diagnosis of SARS-CoV-2 by FDA under an Emergency Use Authorization (EUA). This EUA will remain  in effect (meaning this test can be used) for the duration of the COVID-19 declaration under Section 564(b)(1) of the Act, 21 U.S.C.section 360bbb-3(b)(1), unless the authorization is terminated  or revoked sooner.       Influenza A by PCR NEGATIVE NEGATIVE Final   Influenza B by PCR NEGATIVE NEGATIVE Final    Comment: (NOTE) The Xpert Xpress SARS-CoV-2/FLU/RSV plus assay is intended as an aid in the diagnosis of influenza from Nasopharyngeal swab specimens and should not be used as a sole basis for treatment. Nasal washings and aspirates are unacceptable for Xpert Xpress SARS-CoV-2/FLU/RSV testing.  Fact Sheet for Patients: EntrepreneurPulse.com.au  Fact Sheet for Healthcare Providers: IncredibleEmployment.be  This test is not yet approved or cleared by the [place] FDA and has been authorized for detection and/or diagnosis of SARS-CoV-2 by FDA under an Emergency Use Authorization (EUA). This EUA will remain in effect (meaning this test can be used) for the duration of the COVID-19 declaration under Section 564(b)(1) of the Act, 21 U.S.C. section 360bbb-3(b)(1), unless the authorization is terminated or revoked.  Performed at [facility], [address] [place], [place] [address]      Labs: CBC: Recent Labs  Lab 06/07/21 1412 06/08/21 0547  WBC 6.6 5.4  HGB 13.4 12.3  HCT 37.0 34.6*  MCV 87.3 87.6  PLT 199 [id number]    Basic Metabolic Panel: Recent Labs  Lab 06/07/21 1412 06/08/21 0547  NA 141 141  K 4.3 3.9  CL 106 105  CO2 29 29  GLUCOSE 84 85  BUN 15 12  CREATININE 0.95 0.92  CALCIUM 9.2 9.0    Liver Function Tests: Recent Labs   Lab 06/07/21 1412  AST 34  ALT 21  ALKPHOS 110  BILITOT 1.0  PROT 7.3  ALBUMIN 3.4*   Thyroid function studies Recent Labs    06/07/21 1853  TSH 1.012   Urinalysis    Component Value Date/Time   COLORURINE STRAW (A) 06/07/2021 1459   APPEARANCEUR CLEAR (A) 06/07/2021 1459   LABSPEC 1.005 06/07/2021 1459   PHURINE 7.0 06/07/2021 1459   GLUCOSEU NEGATIVE 06/07/2021 1459   HGBUR NEGATIVE 06/07/2021 1459   BILIRUBINUR NEGATIVE 06/07/2021 1459   KETONESUR NEGATIVE 06/07/2021 1459   PROTEINUR NEGATIVE 06/07/2021 1459   NITRITE NEGATIVE 06/07/2021 1459   LEUKOCYTESUR NEGATIVE 06/07/2021 1459   Discussed extensively with patient's daughter at bedside on multiple occasions, updated care and answered all questions.   Time coordinating discharge: 25 minutes  SIGNED:  Vernell Leep, MD,  FACP, [facility], [facility], [facility] (Care Management Physician Certified). [facility]  To contact the attending provider between 7A-7P or the covering provider during after hours 7P-7A, please log into the web site [email or web address] and access using universal [place] password for that web site. If you do not have the password, please call the hospital operator.

## 2021-06-08 NOTE — Progress Notes (Signed)
Waiting for PA Dunn to see patient with cardio at this time; per hospitalist, if cardiology signs off, patient will be discharged.  ?

## 2021-06-10 NOTE — Telephone Encounter (Signed)
-----   Message from Ryan M Dunn, PA-C sent at 06/08/2021  1:41 PM EDT ----- ?Please schedule follow up for after Zio patch. Thanks! ? ?

## 2021-06-12 ENCOUNTER — Ambulatory Visit (INDEPENDENT_AMBULATORY_CARE_PROVIDER_SITE_OTHER): Payer: Medicare Other | Admitting: Cardiovascular Disease

## 2021-06-12 ENCOUNTER — Other Ambulatory Visit: Payer: Self-pay

## 2021-06-12 ENCOUNTER — Encounter: Payer: Self-pay | Admitting: Cardiovascular Disease

## 2021-06-12 VITALS — BP 142/84 | HR 82 | Ht 65.5 in | Wt 228.5 lb

## 2021-06-12 DIAGNOSIS — I480 Paroxysmal atrial fibrillation: Secondary | ICD-10-CM | POA: Diagnosis not present

## 2021-06-12 DIAGNOSIS — I739 Peripheral vascular disease, unspecified: Secondary | ICD-10-CM

## 2021-06-12 DIAGNOSIS — E782 Mixed hyperlipidemia: Secondary | ICD-10-CM

## 2021-06-12 DIAGNOSIS — I10 Essential (primary) hypertension: Secondary | ICD-10-CM

## 2021-06-12 MED ORDER — PROPRANOLOL HCL 10 MG PO TABS: 10.0000 mg | ORAL_TABLET | Freq: Three times a day (TID) | ORAL | 1 refills | Status: DC | PRN

## 2021-06-12 NOTE — Patient Instructions (Addendum)
Medication Instructions:  ?Propranolol 10 mg up to 20 mg as needed for tachycardia ? ? ?If you need a refill on your cardiac medications before your next appointment, please call your pharmacy.  ? ?Lab work: ?No new labs needed ? ?Testing/Procedures: ?No new testing needed ? ?Follow-Up: ?At [facility], you and your health needs are our priority.  As part of our continuing mission to provide you with exceptional heart care, we have created designated Provider Care Teams.  These Care Teams include your primary Cardiologist (physician) and Advanced Practice Providers (APPs -  Physician Assistants and Nurse Practitioners) who all work together to provide you with the care you need, when you need it. ? ?You will need a follow up appointment in 6 months, APP/Dunn ? ?Providers on your designated Care Team:   ?Murray Hodgkins, NP ?Christell Faith, PA-C ?Cadence Kathlen Mody, PA-C ? ?COVID-19 Vaccine Information can be found at: ShippingScam.co.uk For questions related to vaccine distribution or appointments, please email [email or web address] or call [phone].  ? ?
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
## 2021-06-14 ENCOUNTER — Telehealth: Payer: Self-pay | Admitting: Physician Assistant

## 2021-06-14 DIAGNOSIS — I480 Paroxysmal atrial fibrillation: Secondary | ICD-10-CM | POA: Diagnosis not present

## 2021-06-14 NOTE — Telephone Encounter (Signed)
Page received from [employer], I attempted to call back twice, no answer. ?

## 2021-06-15 NOTE — Telephone Encounter (Signed)
Patient with known permanent Afib. ?

## 2021-06-15 NOTE — Telephone Encounter (Signed)
[name] with [employer] is following up to report abnormal Zio monitor results. Call disconnected before I was able to transfer. Please return call as able. ? ?Phone #: [phone] ref# [id number] ? ? ?

## 2021-06-15 NOTE — Telephone Encounter (Signed)
? ?  Cardiac Monitor Alert ? ?Date of alert:  06/15/2021  ? ?Patient Name: Briana Howe  ?DOB: 06/29/1932  ?MRN: [id number]  ? ?[facility] Cardiologist: Julien Nordmann, MD  ?[facility] EP:  None   ? ?Monitor Information: ?Long Term Monitor-Live Telemetry [ZioAT]  ?Reason:  1st documented atrial fibrillation  ?Ordering provider:  Eula Listen, PA-C ?  ?Alert ?Atrial Fibrillation/Flutter ?This is the 1st alert for this rhythm.  ?The patient has a hx of Atrial Fibrillation/Flutter.  ?The patient is currently on anticoagulation. ?Anticoagulation medication as of 06/14/2021   ? ?    ?  ? ELIQUIS 5 MG TABS tablet TAKE 1 TABLET(5 MG) BY MOUTH TWICE DAILY  ? ?  ? ? ?Next Cardiology Appointment   ?No appointment scheduled, seen on 06/12/2021 by Dr. Mariah Milling, per AVS to be seen back in clinic in 6 months ? ?The patient could NOT be reached by telephone today.  No answer. Left message. ?Arrhythmia, symptoms and history reviewed with Dr. Mariah Milling.  Plan:  If patient calls back, verify that she is asymptomatic. Make sure she knows that she can take propanolol 10 mg as needed for fast heart rate.    ? ?Other: ?N/A ? ?Vergia Alberts, RN  ?06/15/2021 10:14 AM   ?

## 2021-06-15 NOTE — Telephone Encounter (Signed)
Results will actually go to primary cardiologist once report is available.  ?

## 2021-06-15 NOTE — Telephone Encounter (Signed)
[employer] calling with results for this patient  ? ?Call disconnected while on hold. Pending call back.  ?

## 2021-06-15 NOTE — Telephone Encounter (Signed)
Spoke with [employer] at [employer]. This is not the correct patient for the reference number. Will create encounter under correct patient.  ? ?Closing this encounter.  ?

## 2021-06-18 ENCOUNTER — Telehealth: Payer: Self-pay | Admitting: Neurology

## 2021-06-18 NOTE — Telephone Encounter (Signed)
Noted  

## 2021-06-18 NOTE — Telephone Encounter (Signed)
..   Pt understands that although there may be some limitations with this type of visit, we will take all precautions to reduce any security or privacy concerns.  Pt understands that this will be treated like an in office visit and we will file with pt's insurance, and there may be a patient responsible charge related to this service. ? ?

## 2021-06-22 ENCOUNTER — Telehealth (INDEPENDENT_AMBULATORY_CARE_PROVIDER_SITE_OTHER): Payer: Medicare Other | Admitting: Neurology

## 2021-06-22 ENCOUNTER — Telehealth: Payer: Self-pay | Admitting: Neurology

## 2021-06-22 DIAGNOSIS — G301 Alzheimer's disease with late onset: Secondary | ICD-10-CM | POA: Diagnosis not present

## 2021-06-22 DIAGNOSIS — F02818 Dementia in other diseases classified elsewhere, unspecified severity, with other behavioral disturbance: Secondary | ICD-10-CM | POA: Diagnosis not present

## 2021-06-22 MED ORDER — MEMANTINE HCL 10 MG PO TABS: 10.0000 mg | ORAL_TABLET | Freq: Two times a day (BID) | ORAL | 4 refills | Status: DC

## 2021-06-22 MED ORDER — DONEPEZIL HCL 10 MG PO TABS: ORAL_TABLET | ORAL | 4 refills | Status: DC

## 2021-06-22 NOTE — Progress Notes (Signed)
?[facility] ? ? ? ?Provider:  Dr Lucia Howe ?Requesting Provider: Lauro Howe, Briana Howe, Briana Howe ?Primary Care Provider:  Lauro Howe, Briana Howe, Briana Howe ? ? ?Virtual Visit via Video Note ? ?I connected with Briana ApplebaumMargaret Stanfield Howe on 06/23/21 at  2:00 PM EDT by a video enabled telemedicine application and verified that I am speaking with the correct person using two identifiers. ? ?Location: ?Patient: home ?Provider: office ?  ?I discussed the limitations of evaluation and management by telemedicine and the availability of in person appointments. The patient expressed understanding and agreed to proceed. ? ?Follow Up Instructions: ? ?  ?I discussed the assessment and treatment plan with the patient. The patient was provided an opportunity to ask questions and all were answered. The patient agreed with the plan and demonstrated an understanding of the instructions. ?  ?The patient was advised to call back or seek an in-person evaluation if the symptoms worsen or if the condition fails to improve as anticipated. ? ?I provided 30 minutes of non-face-to-face time during this encounter. ? ? ?Briana FretAhern, Briana Hecht Howe, Briana Howe ? ?CC:  Mild Alzheimer's dementia ? ?06/22/2021: She has afib, she was working in her closet and she got up to go to the laundry room and her heart felt like it was going to jump out of her chest. She went to the ED and she stayed there a few days. She is sleeping well. A little uncooperative, but they haven't used the zoloft. Eating is fine, no choking, "eating is wonderful", no falls. Everything going well. She still cooks, daughter says she has been teaching daughter how to make pies, they cook together and patient can still prepare some of the dishes and follow directions of a recipe. She loves sweets.  We will follow in 1 year video. Daughter is seeing Briana Howe and loves him for her MS.  ? ?Patient complains of symptoms per HPI as well as the following symptoms: slight irritability . Pertinent  negatives and positives per HPI. All others negative ? ? ?Patient is here for follow-up December 22, 2020: She is a lovely patient here with her daughter for follow-up of Alzheimer's dementia diagnosed by formal memory testing, past medical history of hypertension, hyperlipidemia, degenerative joint disease, she saw Briana Howe in January of this year, since his initial neuropsychological evaluation she has been about the same, they discussed impression of mild stage Alzheimer's dementia, she was very reasonable did not get agitated, she was encouraged to follow-up with me for the PET scan, today they are here to talk to me.  I reviewed MRI of the brain, there was no evidence of acute intracranial abnormality, very mild cerebral atrophy and chronic small vessel ischemic disease, stable as compared to 11/2018 and not more advanced than for age. ? ?Sma 'ol same 'ol, appetite is good. Stable weight. She loves pop tarts. She is going to eat what she wants. D[place]tr says she get agitated often. She loves sweets. She sold her car. No coughing. She gets 2 good meals in every day, she eats a good meal 3x a day. No falls in the home. Sleeping well. She does some catnapping, she is cleaning and shops. Feels well, not depressed. She gives attitude "I may be old but i'm still spunky", no weakness, she is strong, she socializes. She goes twice a month to church in person. She declines the flu shots and she has allergies. She has bowel movements 3x a week, no probkems urinating. Attitue and cooperation is heard, she give  daughter's a heard time.  ? ?Interval History 07/22/2020; this is a lovely patient here with her daughter for follow-up of Alzheimer's dementia diagnosed by formal memory testing past medical history hypertension, hyperlipidemia, degenerative joint disease.  They are very concerned today about the necessary capabilities to drive successfully, patient feels that she can still drive however she has not driven in  over 8 months, she advises that the only way she will stop driving is if the doctor tells her, they would like [place] to send the [facility] information about her diagnosis, she gets confused as well as making comments by driving when she is a passenger, hears multiple updates: ? ?Has more mood swings, can be quite uncooperative ?Forgetting and repeating herself more ?Only not understanding her own ?Gets upset if you correct or if you try to help her ?Hides things and does not remember where she puts ?Very angry at times ?Refuses to accept her diagnosis and [place] when she does not get her way ?Refuses to communicate in conversation if someone interrupts her but does not see the problem if she interrupts you ?Asked for foods but then forgets to eat it ?Forgets names of objects or people ?Difficulty with comprehending what she is reading ?Constantly grooming her nails and does pedicure almost daily ?Does not feel she needs to eat foods that I did not suggest to her at her last appointment at that she does not have listed in her blue notebook ?Problem solving skills have weekend as well as reaction time ? ?Patient will not let daughter near her medicine. She has a case and she manages her own medicine. She says she takes 3 pills at night. We discussed bundled medications and when she is at her home the other sister helps. She hasn't driven in 8 months. We discussed no driving. She lives with the other daughter but now is with the daughter here today and is staying with her temporarily. She didn't remember her appointment today. Her daughter today is very concerned as above and the daughter thinks she cannot drive safely, reaction times, even with cooking not just driving. She doesn't notice safety concerns anymore. She repeats herself a lot. Her reaction times is slow. She makes comment about traffic. We reviewed diagnosis and Briana Howe testing results.  ? ?Patient complains of symptoms per HPI as well as the following  symptoms: irritability . Pertinent negatives and positives per HPI. All others negative ? ? ? ?HPI:  Briana Howe is a 88 y.o. female here as requested by Briana Ruths, Briana Howe for memory loss. PMHx mild mixed dementia, HTN, HLD, DJD.  ? ?Patient is here with daughter, daughter provides most information, daughter states that she has difficulty following simple one-step directions, and denial when she makes a simple mistake, mood swings, argumentative, repeats herself, very forgetful, mixing her words when talking, denial about having dementia, very defensive about her condition, unwillingness to accept help, and long-term memory is better than short-term memory.  Patient states nothing is wrong.  If they discussed this in front of the doctor she becomes angry and does not want to come back again.  They do not feel the medication has helped her condition this past year (donepezil).  They have gotten 2 diagnoses including white matter disease and dementia and they want to know what her condition is.  She takes Aricept 5 mg nightly.  Symptoms are consistent with very mild mixed dementia.she is able to perform all her ADLs, daughter handles finances this  is not new, daughter states she has difficulty with following multistep directions, also has difficulty with following conversations with multiple people, daughter will often write commands for patient to do, no difficulty with cooking, patient states she enjoys baking, continues to drive without getting lost, denies difficulty sleeping, takes Aricept.Older sister likely has dementia is 62/96 and she had another sister that recently passed that started having dementia they think but no formal testing was provided. Family is very concerned, they are noticing things patient is doing, repeating herself. They are trying to help her. Patient is in denial. Patient states  ? ? ?I reviewed notes from physicians, patient's memory loss is stable, memory has been  okay, takes Aricept 5 mg nightly, symptoms consistent with very mild mixed dementia, testing 18 out of 30, reviewed MRI of the brain which showed cerebral volume normal for age, mild chronic white matter changes cons

## 2021-06-22 NOTE — Telephone Encounter (Signed)
Please call and schedule patient for a year follow up I one year Dr. Lucia Gaskins over  video. ?

## 2021-06-23 ENCOUNTER — Encounter: Payer: Self-pay | Admitting: Neurology

## 2021-07-02 ENCOUNTER — Ambulatory Visit: Payer: Medicare Other | Attending: Internal Medicine

## 2021-07-02 DIAGNOSIS — M5459 Other low back pain: Secondary | ICD-10-CM | POA: Diagnosis present

## 2021-07-02 DIAGNOSIS — M6281 Muscle weakness (generalized): Secondary | ICD-10-CM | POA: Insufficient documentation
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
## 2021-07-03 ENCOUNTER — Other Ambulatory Visit: Payer: Self-pay | Admitting: Cardiovascular Disease

## 2021-07-06 ENCOUNTER — Ambulatory Visit: Payer: Medicare Other

## 2021-07-06 DIAGNOSIS — M6281 Muscle weakness (generalized): Secondary | ICD-10-CM

## 2021-07-06 DIAGNOSIS — M5459 Other low back pain: Secondary | ICD-10-CM
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
## 2021-07-07 ENCOUNTER — Telehealth: Payer: Self-pay | Admitting: *Deleted

## 2021-07-07 NOTE — Telephone Encounter (Signed)
-----   Message from Sondra Barges, PA-C sent at 07/07/2021  1:44 PM EDT ----- ?Outpatient cardiac monitoring showed a 100% burden of atrial flutter with rates ranging from 38 to 138 bpm with an average rate of 74 bpm.  It appears the majority of her bradycardic rates occurred in the overnight hours, presumably while sleeping.  Does not appear to be an acute indication for PPM, and overall rates appear well controlled.  Will forward to patient's primary cardiologist as an FYI given he has not seen her since her hospital admission, to see if he would like to adjust any therapies at this time. ?

## 2021-07-07 NOTE — Telephone Encounter (Signed)
Reviewed results with patients daughter per release form. She inquired if she should be seen sooner and advised that since she just saw him here in the office she should be good unless he reviews these results with further recommendations. Reviewed her chart and she is to follow up in 6 months from her last visit 06/12/21. Daughter verbalized understanding of results with no further questions at this time.  ?

## 2021-07-09 ENCOUNTER — Encounter: Payer: Self-pay | Admitting: *Deleted

## 2021-07-09 NOTE — Telephone Encounter (Signed)
Error

## 2021-07-09 NOTE — Telephone Encounter (Signed)
Left voicemail message on daughter's number. Left detailed voicemail message on patients number per release form that primary provider reviewed and no additional changes at this time with instructions to call back if any further questions.  ? ? ?Antonieta Iba, MD  ?SentLucia Bitter July 08, 2021 11:58 AM  ?To: Sondra Barges, PA-C  ? ?Result Note ? ?Looks okay to me, I do not think we need to adjust anything  ?Good average heart rate  ?Thx  ?TGollan  ? ?Sondra Barges, PA-C  ?Sent: Wed July 08, 2021  6:40 PM  ? ?Result Note ? ?Please let patient know that Dr. Mariah Milling has reviewed the [facility] and indicated results look good. No changes in medications or follow up plan.   ? ?

## 2021-07-16 ENCOUNTER — Ambulatory Visit: Payer: Medicare Other

## 2021-07-16 DIAGNOSIS — M5459 Other low back pain: Secondary | ICD-10-CM

## 2021-07-16 DIAGNOSIS — M6281 Muscle weakness (generalized): Secondary | ICD-10-CM
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
## 2021-07-23 ENCOUNTER — Other Ambulatory Visit: Payer: Self-pay | Admitting: Cardiovascular Disease

## 2021-07-23 ENCOUNTER — Ambulatory Visit: Payer: Medicare Other

## 2021-07-23 DIAGNOSIS — M5459 Other low back pain: Secondary | ICD-10-CM

## 2021-07-23 DIAGNOSIS — M6281 Muscle weakness (generalized): Secondary | ICD-10-CM
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
## 2021-08-06 ENCOUNTER — Ambulatory Visit: Payer: Medicare Other | Attending: Internal Medicine

## 2021-08-06 DIAGNOSIS — M5459 Other low back pain: Secondary | ICD-10-CM | POA: Diagnosis present

## 2021-08-06 DIAGNOSIS — M6281 Muscle weakness (generalized): Secondary | ICD-10-CM | POA: Insufficient documentation
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
## 2021-08-11 ENCOUNTER — Ambulatory Visit: Payer: Medicare Other

## 2021-08-11 DIAGNOSIS — M5459 Other low back pain: Secondary | ICD-10-CM | POA: Diagnosis not present

## 2021-08-11 DIAGNOSIS — M6281 Muscle weakness (generalized): Secondary | ICD-10-CM
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
## 2021-08-13 ENCOUNTER — Ambulatory Visit: Payer: Medicare Other

## 2021-08-13 DIAGNOSIS — M5459 Other low back pain: Secondary | ICD-10-CM | POA: Diagnosis not present

## 2021-08-13 DIAGNOSIS — M6281 Muscle weakness (generalized): Secondary | ICD-10-CM
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
## 2021-08-14 ENCOUNTER — Telehealth: Payer: Self-pay | Admitting: Cardiovascular Disease

## 2021-08-14 NOTE — Telephone Encounter (Signed)
Daughter called to say that the patient see blood. Please advise

## 2021-08-14 NOTE — Telephone Encounter (Signed)
Called and spoke with patient's daughter.   Got clarification on where patient is seeing blood. Daughter reports that when pt has a BM she sees a very small amount of blood on the toilet paper.   I advised that she should try reaching out to patient's PCP for advice, as being on a blood thinner could make bleeding worse, but should cause any bleeding.   Daughter verbalized understanding and voiced appreciation for the call.

## 2021-08-17 ENCOUNTER — Ambulatory Visit: Payer: Medicare Other

## 2021-08-17 DIAGNOSIS — M6281 Muscle weakness (generalized): Secondary | ICD-10-CM

## 2021-08-17 DIAGNOSIS — M5459 Other low back pain: Secondary | ICD-10-CM | POA: Diagnosis not present
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
## 2021-08-19 ENCOUNTER — Ambulatory Visit: Payer: Medicare Other | Admitting: Physical Therapy

## 2021-08-19 ENCOUNTER — Encounter: Payer: Self-pay | Admitting: Physical Therapy

## 2021-08-19 DIAGNOSIS — M6281 Muscle weakness (generalized): Secondary | ICD-10-CM

## 2021-08-19 DIAGNOSIS — M5459 Other low back pain: Secondary | ICD-10-CM
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
## 2021-08-26 ENCOUNTER — Ambulatory Visit: Payer: Medicare Other

## 2021-08-26 DIAGNOSIS — M5459 Other low back pain: Secondary | ICD-10-CM | POA: Diagnosis not present

## 2021-08-26 DIAGNOSIS — M6281 Muscle weakness (generalized): Secondary | ICD-10-CM
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
## 2021-08-31 ENCOUNTER — Ambulatory Visit: Payer: Medicare Other | Attending: Internal Medicine

## 2021-08-31 DIAGNOSIS — M5459 Other low back pain: Secondary | ICD-10-CM | POA: Insufficient documentation

## 2021-08-31 DIAGNOSIS — M6281 Muscle weakness (generalized): Secondary | ICD-10-CM | POA: Insufficient documentation

## 2021-08-31 NOTE — Addendum Note (Signed)
Addended by: Claudie Fisherman on: 08/31/2021 11:19 AM   Modules accepted: Orders
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
## 2021-09-02 ENCOUNTER — Ambulatory Visit: Payer: Medicare Other

## 2021-09-02 ENCOUNTER — Telehealth: Payer: Self-pay | Admitting: Cardiovascular Disease

## 2021-09-02 DIAGNOSIS — M5459 Other low back pain: Secondary | ICD-10-CM

## 2021-09-02 DIAGNOSIS — M6281 Muscle weakness (generalized): Secondary | ICD-10-CM

## 2021-09-02 NOTE — Telephone Encounter (Signed)
Patient was brought to our office by her physical therapist due to elevated BP's as documented below.  Spoke to Dr. Mariah Milling and he recommended that the patient take her prn Propranolol 10 MG TID and check BP at home daily for the next few days.   I spoke with the patient in the lobby and her daughter on the phone. Patients daughter stated that she gave the propranolol once yesterday for a HR of 135, but otherwise has not been using it. She agreed to give it for the high blood pressure and will check patients BP 1-2 hours after medications daily. She will let us know in a few days how her BP is doing.  Patient was very grateful for the advice.
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
## 2021-09-02 NOTE — Telephone Encounter (Signed)
Pt c/o BP issue: STAT if pt c/o blurred vision, one-sided weakness or slurred speech  1. What are your last 5 BP readings? 188/130 right, 182/114, left  2. Are you having any other symptoms (ex. Dizziness, headache, blurred vision, passed out)? SOB  3. What is your BP issue? Elevated with SOB

## 2021-09-07 ENCOUNTER — Ambulatory Visit: Payer: Medicare Other

## 2021-09-07 ENCOUNTER — Encounter: Payer: Self-pay | Admitting: Cardiovascular Disease

## 2021-09-07 DIAGNOSIS — M5459 Other low back pain: Secondary | ICD-10-CM

## 2021-09-07 DIAGNOSIS — M6281 Muscle weakness (generalized): Secondary | ICD-10-CM
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
## 2021-09-09 ENCOUNTER — Ambulatory Visit: Payer: Medicare Other

## 2021-09-09 DIAGNOSIS — M6281 Muscle weakness (generalized): Secondary | ICD-10-CM

## 2021-09-09 DIAGNOSIS — M5459 Other low back pain: Secondary | ICD-10-CM | POA: Diagnosis not present
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
## 2021-09-14 ENCOUNTER — Ambulatory Visit: Payer: Medicare Other

## 2021-09-16 ENCOUNTER — Ambulatory Visit: Payer: Medicare Other

## 2021-09-16 DIAGNOSIS — M6281 Muscle weakness (generalized): Secondary | ICD-10-CM

## 2021-09-16 DIAGNOSIS — M5459 Other low back pain: Secondary | ICD-10-CM | POA: Diagnosis not present
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
## 2021-09-19 ENCOUNTER — Other Ambulatory Visit: Payer: Self-pay | Admitting: Cardiovascular Disease

## 2021-09-21 ENCOUNTER — Ambulatory Visit (INDEPENDENT_AMBULATORY_CARE_PROVIDER_SITE_OTHER): Payer: Medicare Other

## 2021-09-21 ENCOUNTER — Ambulatory Visit: Payer: Medicare Other

## 2021-09-21 ENCOUNTER — Ambulatory Visit (INDEPENDENT_AMBULATORY_CARE_PROVIDER_SITE_OTHER): Payer: Medicare Other | Admitting: Family Medicine

## 2021-09-21 ENCOUNTER — Encounter: Payer: Self-pay | Admitting: Family Medicine

## 2021-09-21 VITALS — BP 140/96 | HR 60 | Ht 65.5 in | Wt 237.0 lb

## 2021-09-21 DIAGNOSIS — M79604 Pain in right leg: Secondary | ICD-10-CM

## 2021-09-23 ENCOUNTER — Ambulatory Visit: Payer: Medicare Other

## 2021-09-23 DIAGNOSIS — M6281 Muscle weakness (generalized): Secondary | ICD-10-CM

## 2021-09-23 DIAGNOSIS — M5459 Other low back pain: Secondary | ICD-10-CM
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
## 2021-09-24 ENCOUNTER — Encounter: Payer: Self-pay | Admitting: Cardiovascular Disease

## 2021-09-28 ENCOUNTER — Ambulatory Visit: Payer: Medicare Other

## 2021-09-28 ENCOUNTER — Other Ambulatory Visit: Payer: Self-pay | Admitting: *Deleted

## 2021-09-28 MED ORDER — LISINOPRIL 20 MG PO TABS: 20.0000 mg | ORAL_TABLET | ORAL | 6 refills | Status: DC

## 2021-09-29 ENCOUNTER — Other Ambulatory Visit: Payer: Self-pay | Admitting: Cardiovascular Disease

## 2021-09-30 ENCOUNTER — Ambulatory Visit: Payer: Medicare Other | Attending: Internal Medicine

## 2021-09-30 DIAGNOSIS — M6281 Muscle weakness (generalized): Secondary | ICD-10-CM | POA: Diagnosis present

## 2021-09-30 DIAGNOSIS — M5459 Other low back pain: Secondary | ICD-10-CM | POA: Insufficient documentation

## 2021-09-30 NOTE — Telephone Encounter (Signed)
Prescription refill request for Eliquis received. Indication:Afib Last office visit:3/23 Scr:0.9 Age: 88 Weight:107.5 kg  Prescription refilled

## 2021-09-30 NOTE — Telephone Encounter (Signed)
Refill Request.  
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
## 2021-10-05 ENCOUNTER — Ambulatory Visit: Payer: Medicare Other

## 2021-10-07 ENCOUNTER — Ambulatory Visit: Payer: Medicare Other

## 2021-10-07 DIAGNOSIS — M6281 Muscle weakness (generalized): Secondary | ICD-10-CM

## 2021-10-07 DIAGNOSIS — M5459 Other low back pain: Secondary | ICD-10-CM
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
## 2021-10-12 ENCOUNTER — Ambulatory Visit: Payer: Medicare Other

## 2021-10-14 ENCOUNTER — Ambulatory Visit: Payer: Medicare Other

## 2021-10-14 DIAGNOSIS — M6281 Muscle weakness (generalized): Secondary | ICD-10-CM

## 2021-10-14 DIAGNOSIS — M5459 Other low back pain: Secondary | ICD-10-CM | POA: Diagnosis not present
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
## 2021-10-19 ENCOUNTER — Ambulatory Visit: Payer: Medicare Other

## 2021-10-19 NOTE — Progress Notes (Unsigned)
   I, Philbert Riser, LAT, ATC acting as a scribe for Clementeen Graham, MD.  Briana Howe is a 88 y.o. female who presents to [facility] today for f/u R leg pain thought to be due to L5 radiculopathy and LBP. Pt was last seen by Dr. Denyse Amass on 09/21/21 and was advised to wear compression stockings and cont PT, completing 19 visits. Today, pt reports  Dx testing: 09/21/21 L-spine XR 10/08/19 Venous US R LE             08/31/18 Venous US R LE             09/27/16 Venous US R LE  Pertinent review of systems: ***  Relevant historical information: ***   Exam:  There were no vitals taken for this visit. General: Well Developed, well nourished, and in no acute distress.   MSK: ***    Lab and Radiology Results No results found for this or any previous visit (from the past 72 hour(s)). No results found.     Assessment and Plan: 88 y.o. female with ***   PDMP not reviewed this encounter. No orders of the defined types were placed in this encounter.  No orders of the defined types were placed in this encounter.    Discussed warning signs or symptoms. Please see discharge instructions. Patient expresses understanding.   ***

## 2021-10-20 ENCOUNTER — Ambulatory Visit (INDEPENDENT_AMBULATORY_CARE_PROVIDER_SITE_OTHER): Payer: Medicare Other | Admitting: Family Medicine

## 2021-10-20 VITALS — BP 130/84 | HR 77 | Ht 65.5 in | Wt 238.6 lb

## 2021-10-20 DIAGNOSIS — M5416 Radiculopathy, lumbar region: Secondary | ICD-10-CM

## 2021-10-20 NOTE — Patient Instructions (Addendum)
Thank you for coming in today.   Check back as needed 

## 2021-10-21 ENCOUNTER — Ambulatory Visit: Payer: Medicare Other

## 2021-10-21 DIAGNOSIS — M5459 Other low back pain: Secondary | ICD-10-CM | POA: Diagnosis not present

## 2021-10-21 DIAGNOSIS — M6281 Muscle weakness (generalized): Secondary | ICD-10-CM
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
## 2021-10-26 ENCOUNTER — Ambulatory Visit: Payer: Medicare Other
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
## 2021-10-28 ENCOUNTER — Ambulatory Visit: Payer: Medicare Other | Attending: Internal Medicine

## 2021-10-28 DIAGNOSIS — M6281 Muscle weakness (generalized): Secondary | ICD-10-CM

## 2021-10-28 DIAGNOSIS — M5459 Other low back pain: Secondary | ICD-10-CM | POA: Diagnosis present

## 2021-11-02 ENCOUNTER — Ambulatory Visit: Payer: Medicare Other

## 2021-11-04 ENCOUNTER — Ambulatory Visit: Payer: Medicare Other

## 2021-11-09 ENCOUNTER — Ambulatory Visit: Payer: Medicare Other

## 2021-11-11 ENCOUNTER — Ambulatory Visit: Payer: Medicare Other

## 2021-11-16 ENCOUNTER — Ambulatory Visit: Payer: Medicare Other

## 2021-11-18 ENCOUNTER — Ambulatory Visit: Payer: Medicare Other

## 2021-11-23 ENCOUNTER — Ambulatory Visit: Payer: Medicare Other

## 2021-11-25 ENCOUNTER — Ambulatory Visit: Payer: Medicare Other

## 2021-12-21 ENCOUNTER — Other Ambulatory Visit: Payer: Self-pay | Admitting: Neurology

## 2022-01-11 ENCOUNTER — Ambulatory Visit (LOCAL_COMMUNITY_HEALTH_CENTER): Payer: Medicare Other

## 2022-01-11 DIAGNOSIS — Z719 Counseling, unspecified: Secondary | ICD-10-CM

## 2022-01-11 DIAGNOSIS — Z23 Encounter for immunization: Secondary | ICD-10-CM | POA: Diagnosis not present

## 2022-01-11 NOTE — Progress Notes (Signed)
  Are you feeling sick today? No   Have you ever received a dose of COVID-19 Vaccine? [employer], [place], [place], [place], Other) Yes  If yes, which vaccine and how many doses?   Moderna, 3   Did you bring the vaccination record card or other documentation?  Yes   Do you have a health condition or are undergoing treatment that makes you moderately or severely immunocompromised? This would include, but not be limited to: cancer, HIV, organ transplant, immunosuppressive therapy/high-dose corticosteroids, or moderate/severe primary immunodeficiency.  No  Have you received COVID-19 vaccine before or during hematopoietic cell transplant (HCT) or CAR-T-cell therapies? No  Have you ever had an allergic reaction to: (This would include a severe allergic reaction or a reaction that caused hives, swelling, or respiratory distress, including wheezing.) A component of a COVID-19 vaccine or a previous dose of COVID-19 vaccine? No   Have you ever had an allergic reaction to another vaccine (other thanCOVID-19 vaccine) or an injectable medication? (This would include a severe allergic reaction or a reaction that caused hives, swelling, or respiratory distress, including wheezing.)   No    Do you have a history of any of the following:  Myocarditis or Pericarditis No  Dermal fillers:  No  Multisystem Inflammatory Syndrome (MIS-C or MIS-A)? No  COVID-19 disease within the past 3 months? No  Vaccinated with monkeypox vaccine in the last 4 weeks? No   Eligible and administered SpikeVax 12+2023-24, monitored, tolerated well. Verbalized understanding of VIS and NCIR. M.Shanin Szymanowski, LPN.

## 2022-01-14 ENCOUNTER — Other Ambulatory Visit: Payer: Self-pay

## 2022-01-14 MED ORDER — PROPRANOLOL HCL 10 MG PO TABS: ORAL_TABLET | ORAL | 0 refills | Status: DC

## 2022-02-17 ENCOUNTER — Other Ambulatory Visit: Payer: Self-pay | Admitting: Internal Medicine

## 2022-02-17 DIAGNOSIS — Z1231 Encounter for screening mammogram for malignant neoplasm of breast: Secondary | ICD-10-CM

## 2022-03-15 ENCOUNTER — Other Ambulatory Visit: Payer: Self-pay | Admitting: Neurology

## 2022-03-24 ENCOUNTER — Other Ambulatory Visit: Payer: Self-pay

## 2022-03-24 MED ORDER — FUROSEMIDE 20 MG PO TABS
ORAL_TABLET | ORAL | 1 refills | Status: DC
Start: 2022-03-24 — End: 2022-04-13

## 2022-03-24 MED ORDER — APIXABAN 5 MG PO TABS
ORAL_TABLET | ORAL | 1 refills | Status: DC
Start: 2022-03-24 — End: 2022-09-20

## 2022-03-24 NOTE — Telephone Encounter (Signed)
Prescription refill request for Eliquis received. Indication:afib Last office visit:3/23 Scr:0.9 Age: 88 Weight:108.2  kg  Prescription refilled

## 2022-03-24 NOTE — Telephone Encounter (Signed)
Refill sent for Furosemide 20 mg  

## 2022-04-02 ENCOUNTER — Ambulatory Visit
Admission: RE | Admit: 2022-04-02 | Discharge: 2022-04-02 | Disposition: A | Discharge status: Home / Self Care | Payer: 59 | Source: Ambulatory Visit | Attending: Internal Medicine | Admitting: Internal Medicine

## 2022-04-02 DIAGNOSIS — Z1231 Encounter for screening mammogram for malignant neoplasm of breast: Secondary | ICD-10-CM | POA: Diagnosis not present
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
## 2022-04-13 ENCOUNTER — Ambulatory Visit: Payer: 59 | Attending: Cardiovascular Disease | Admitting: Cardiovascular Disease

## 2022-04-13 ENCOUNTER — Other Ambulatory Visit: Payer: Self-pay | Admitting: Cardiovascular Disease

## 2022-04-13 ENCOUNTER — Encounter: Payer: Self-pay | Admitting: Cardiovascular Disease

## 2022-04-13 VITALS — BP 160/94 | HR 77 | Ht 66.0 in | Wt 246.0 lb

## 2022-04-13 DIAGNOSIS — I4891 Unspecified atrial fibrillation: Secondary | ICD-10-CM

## 2022-04-13 DIAGNOSIS — E782 Mixed hyperlipidemia: Secondary | ICD-10-CM

## 2022-04-13 DIAGNOSIS — I739 Peripheral vascular disease, unspecified: Secondary | ICD-10-CM

## 2022-04-13 DIAGNOSIS — I10 Essential (primary) hypertension: Secondary | ICD-10-CM | POA: Diagnosis not present

## 2022-04-13 DIAGNOSIS — I480 Paroxysmal atrial fibrillation: Secondary | ICD-10-CM | POA: Diagnosis not present

## 2022-04-13 MED ORDER — FUROSEMIDE 20 MG PO TABS: 20.0000 mg | ORAL_TABLET | Freq: Every day | ORAL | 1 refills | Status: DC

## 2022-04-13 NOTE — Patient Instructions (Addendum)
Medication Instructions:  Lasix daily with compression hose Lasix 20 daily with extra as needed  Please call if weight does not trend downward  Monitor Blood pressure , call if numbers elevated >140  If you need a refill on your cardiac medications before your next appointment, please call your pharmacy.   Lab work: No new labs needed  Testing/Procedures: No new testing needed  Follow-Up: At [facility], you and your health needs are our priority.  As part of our continuing mission to provide you with exceptional heart care, we have created designated Provider Care Teams.  These Care Teams include your primary Cardiologist (physician) and Advanced Practice Providers (APPs -  Physician Assistants and Nurse Practitioners) who all work together to provide you with the care you need, when you need it.  You will need a follow up appointment in 6 months  Providers on your designated Care Team:   Murray Hodgkins, NP Christell Faith, PA-C Cadence Kathlen Mody, [place]  COVID-19 Vaccine Information can be found at: [email or web address].uk For questions related to vaccine distribution or appointments, please email vaccine@ .com or call [phone].

## 2022-04-20 ENCOUNTER — Encounter: Payer: Self-pay | Admitting: Cardiovascular Disease

## 2022-04-21 ENCOUNTER — Other Ambulatory Visit: Payer: Self-pay | Admitting: Cardiovascular Disease

## 2022-04-26 ENCOUNTER — Encounter: Payer: Self-pay | Admitting: Cardiovascular Disease

## 2022-06-28 ENCOUNTER — Telehealth: Payer: Medicare Other | Admitting: Neurology

## 2022-07-09 ENCOUNTER — Other Ambulatory Visit: Payer: Self-pay | Admitting: Cardiovascular Disease

## 2022-08-02 ENCOUNTER — Telehealth: Payer: Self-pay | Admitting: Neurology

## 2022-08-02 NOTE — Telephone Encounter (Signed)
Noted per Dr. Celene Squibb note, ok for VV.

## 2022-08-02 NOTE — Telephone Encounter (Signed)
..   Pt understands that although there may be some limitations with this type of visit, we will take all precautions to reduce any security or privacy concerns.  Pt understands that this will be treated like an in office visit and we will file with pt's insurance, and there may be a patient responsible charge related to this service. ? ?

## 2022-09-20 ENCOUNTER — Other Ambulatory Visit: Payer: Self-pay | Admitting: Neurology

## 2022-09-20 ENCOUNTER — Other Ambulatory Visit: Payer: Self-pay | Admitting: Cardiovascular Disease

## 2022-09-20 NOTE — Telephone Encounter (Signed)
Refill request

## 2022-09-20 NOTE — Telephone Encounter (Signed)
Prescription refill request for Eliquis received. Indication:afib Last office visit:1/24 Scr:1.0  1/24 Age: 89 Weight:111.6  ml/min  Prescription refilled

## 2022-09-23 ENCOUNTER — Other Ambulatory Visit: Payer: Self-pay | Admitting: Neurology

## 2022-09-24 ENCOUNTER — Other Ambulatory Visit: Payer: Self-pay | Admitting: Cardiovascular Disease

## 2022-09-24 ENCOUNTER — Encounter: Payer: Self-pay | Admitting: Cardiovascular Disease

## 2022-09-27 ENCOUNTER — Other Ambulatory Visit: Payer: Self-pay

## 2022-09-27 ENCOUNTER — Telehealth: Payer: Self-pay | Admitting: Cardiovascular Disease

## 2022-09-27 DIAGNOSIS — I480 Paroxysmal atrial fibrillation: Secondary | ICD-10-CM

## 2022-09-27 DIAGNOSIS — I739 Peripheral vascular disease, unspecified: Secondary | ICD-10-CM

## 2022-09-27 DIAGNOSIS — I10 Essential (primary) hypertension: Secondary | ICD-10-CM

## 2022-09-27 MED ORDER — SPIRONOLACTONE 25 MG PO TABS: 25.0000 mg | ORAL_TABLET | Freq: Every day | ORAL | 3 refills | Status: DC

## 2022-09-27 NOTE — Telephone Encounter (Signed)
Unable to leave voice mail to schedule follow up appointment.

## 2022-09-27 NOTE — Telephone Encounter (Signed)
Good Morning,  Could you please schedule this patient a 6 month follow up visit? The patient was last seen by Dr. Mariah Milling on 04-13-22. Thank you so much.

## 2022-09-27 NOTE — Telephone Encounter (Signed)
Unable to leave voicemail due to mailbox being full.

## 2022-09-27 NOTE — Progress Notes (Signed)
Blood pressures are running high Would recommend we start spironolactone 25 daily Will need BMP in 2 to 3 weeks time  Thx TG

## 2022-09-29 NOTE — Telephone Encounter (Signed)
Pt is scheduled on 9/6.

## 2022-10-17 ENCOUNTER — Other Ambulatory Visit: Payer: Self-pay | Admitting: Cardiovascular Disease

## 2022-11-15 ENCOUNTER — Encounter: Payer: Self-pay | Admitting: Neurology

## 2022-11-15 ENCOUNTER — Telehealth: Payer: Self-pay | Admitting: Neurology

## 2022-11-15 ENCOUNTER — Telehealth: Payer: 59 | Admitting: Neurology

## 2022-11-15 DIAGNOSIS — F02818 Dementia in other diseases classified elsewhere, unspecified severity, with other behavioral disturbance: Secondary | ICD-10-CM | POA: Diagnosis not present

## 2022-11-15 DIAGNOSIS — G301 Alzheimer's disease with late onset: Secondary | ICD-10-CM

## 2022-11-15 MED ORDER — DONEPEZIL HCL 10 MG PO TABS: ORAL_TABLET | ORAL | 4 refills | Status: DC

## 2022-11-15 MED ORDER — MEMANTINE HCL 10 MG PO TABS: 10.0000 mg | ORAL_TABLET | Freq: Two times a day (BID) | ORAL | 4 refills | Status: DC

## 2022-11-15 NOTE — Telephone Encounter (Signed)
F/u 6 months with megan. Let them know I'd like them to meet my partner so at least 2 people in the office know patient just in case I am not here one day that she needs me. Can be video

## 2022-11-15 NOTE — Patient Instructions (Addendum)
[email or web address]  Non-drug approaches Non-drug approaches to managing behavior symptoms promote physical and emotional comfort.  Many of these strategies aim to identify and address needs that the person with Alzheimer's may have difficulty expressing as the disease progresses. Non-drug approaches should always be tried first.  Steps to developing successful non-drug treatments include:  Recognizing that the person is not just "acting mean or ornery," but is having further symptoms of the disease. Identifying the cause and how the symptom may relate to the experience of the person with Alzheimer's. Changing the environment to resolve challenges and obstacles to comfort, security and ease of mind. Coping tips Monitor personal comfort. Check for pain, hunger, thirst, constipation, full bladder, fatigue, infections and skin irritation. Maintain a comfortable room temperature. Avoid being confrontational or arguing about facts. For example, if a person expresses a wish to go visit a parent who died years ago, don't point out that the parent is dead. Instead, say, "Your mother is a wonderful person. I would like to see her too." Redirect the person's attention. Try to remain flexible, patient and supportive by responding to the emotion, not the behavior. Create a calm environment. Avoid noise, glare, insecure space and too much background distraction, including television. Allow adequate rest between stimulating events. Provide a security object. Acknowledge requests, and respond to them. Look for reasons behind each behavior. Consult a physician to identify any causes related to medications or illness. Explore various solutions. Don't take the behavior personally, and share your experiences with others. Our online social networking community can also help you. Join [employer] and learn tips for coping with a loved one's behavior and find  support from other caregivers.
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
## 2022-11-15 NOTE — Telephone Encounter (Signed)
Pt scheduled for video visit with Megan for 05/17/23 at 1:45pm

## 2022-11-30 ENCOUNTER — Other Ambulatory Visit: Payer: Self-pay | Admitting: Neurology

## 2022-12-02 ENCOUNTER — Other Ambulatory Visit: Payer: Self-pay | Admitting: *Deleted

## 2022-12-02 MED ORDER — DONEPEZIL HCL 10 MG PO TABS: ORAL_TABLET | ORAL | 4 refills | Status: DC

## 2022-12-02 MED ORDER — MEMANTINE HCL 10 MG PO TABS: 10.0000 mg | ORAL_TABLET | Freq: Two times a day (BID) | ORAL | 2 refills | Status: DC
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
## 2022-12-03 ENCOUNTER — Encounter: Payer: Self-pay | Admitting: Cardiovascular Disease

## 2022-12-03 ENCOUNTER — Ambulatory Visit: Payer: 59 | Attending: Cardiovascular Disease | Admitting: Cardiovascular Disease

## 2022-12-03 VITALS — BP 136/80 | HR 69 | Ht 66.0 in | Wt 241.0 lb

## 2022-12-03 DIAGNOSIS — I4891 Unspecified atrial fibrillation: Secondary | ICD-10-CM

## 2022-12-03 DIAGNOSIS — I480 Paroxysmal atrial fibrillation: Secondary | ICD-10-CM

## 2022-12-03 DIAGNOSIS — Z79899 Other long term (current) drug therapy: Secondary | ICD-10-CM

## 2022-12-03 DIAGNOSIS — I739 Peripheral vascular disease, unspecified: Secondary | ICD-10-CM

## 2022-12-03 DIAGNOSIS — E782 Mixed hyperlipidemia: Secondary | ICD-10-CM

## 2022-12-03 DIAGNOSIS — I10 Essential (primary) hypertension: Secondary | ICD-10-CM

## 2022-12-03 MED ORDER — PROPRANOLOL HCL 10 MG PO TABS: ORAL_TABLET | ORAL | 4 refills | Status: DC

## 2022-12-03 MED ORDER — POTASSIUM CHLORIDE CRYS ER 10 MEQ PO TBCR: 10.0000 meq | EXTENDED_RELEASE_TABLET | Freq: Every day | ORAL | 3 refills | Status: DC

## 2022-12-03 MED ORDER — SPIRONOLACTONE 25 MG PO TABS: 25.0000 mg | ORAL_TABLET | Freq: Every day | ORAL | 3 refills | Status: DC

## 2022-12-03 NOTE — Patient Instructions (Addendum)
Medication Instructions:  Please start taking Spironolactone 25 mg daily.  Please take Potassium 10 Meq four days a week.   If you need a refill on your cardiac medications before your next appointment, please call your pharmacy.   Lab work: Your provider would like for you to return in 3-4 weeks to have the following labs drawn: BMP.   Please go to [facility] [address] ([facility] [address], [place] [address] You do not need an appointment.  They are open from 7:30 am-4 pm.  Lunch from 1:00 pm- 2:00 pm You will not need to be fasting.   You may also go to any of these [facility] locations:  [facility]  - [address] [employer] [address] Chief Technology Officer)      Testing/Procedures: No new testing needed  Follow-Up: At [facility], you and your health needs are our priority.  As part of our continuing mission to provide you with exceptional heart care, we have created designated Provider Care Teams.  These Care Teams include your primary Cardiologist (physician) and Advanced Practice Providers (APPs -  Physician Assistants and Nurse Practitioners) who all work together to provide you with the care you need, when you need it.  You will need a follow up appointment in 12 months  Providers on your designated Care Team:   Nicolasa Ducking, NP Eula Listen, PA-C Cadence Fransico Michael, [place]  COVID-19 Vaccine Information can be found at: [email or web address] For questions related to vaccine distribution or appointments, please email [email or web address] or call [phone].

## 2022-12-06 ENCOUNTER — Encounter: Payer: Self-pay | Admitting: Cardiovascular Disease

## 2022-12-06 ENCOUNTER — Other Ambulatory Visit: Payer: Self-pay

## 2022-12-06 ENCOUNTER — Encounter: Payer: Self-pay | Admitting: Neurology

## 2022-12-06 MED ORDER — LISINOPRIL 20 MG PO TABS: ORAL_TABLET | ORAL | 6 refills | Status: DC

## 2022-12-19 ENCOUNTER — Encounter: Payer: Self-pay | Admitting: Cardiovascular Disease

## 2022-12-20 ENCOUNTER — Other Ambulatory Visit: Payer: Self-pay | Admitting: *Deleted

## 2022-12-20 MED ORDER — FUROSEMIDE 20 MG PO TABS: 20.0000 mg | ORAL_TABLET | Freq: Every day | ORAL | 3 refills | Status: DC

## 2022-12-24 LAB — BASIC METABOLIC PANEL
BUN/Creatinine Ratio: 19 (ref 12–28)
BUN: 22 mg/dL (ref 8–27)
CO2: 26 mmol/L (ref 20–29)
Calcium: 9.6 mg/dL (ref 8.7–10.3)
Chloride: 106 mmol/L (ref 96–106)
Creatinine, Ser: 1.14 mg/dL — ABNORMAL HIGH (ref 0.57–1.00)
Glucose: 93 mg/dL (ref 70–99)
Potassium: 4.4 mmol/L (ref 3.5–5.2)
Sodium: 145 mmol/L — ABNORMAL HIGH (ref 134–144)
eGFR: 46 mL/min/{1.73_m2} — ABNORMAL LOW (ref >59)

## 2023-01-11 ENCOUNTER — Encounter: Payer: Self-pay | Admitting: Cardiovascular Disease

## 2023-01-11 ENCOUNTER — Other Ambulatory Visit: Payer: Self-pay

## 2023-01-11 MED ORDER — PROPRANOLOL HCL 10 MG PO TABS: ORAL_TABLET | ORAL | 1 refills | Status: DC

## 2023-03-05 ENCOUNTER — Other Ambulatory Visit: Payer: Self-pay | Admitting: Cardiovascular Disease

## 2023-03-07 NOTE — Telephone Encounter (Signed)
Prescription refill request for Eliquis received. Indication:afib Last office visit:9/24 Scr:1.14  9/24 Age: 89 Weight:109.3  kg  Prescription refilled

## 2023-03-14 ENCOUNTER — Other Ambulatory Visit: Payer: Self-pay | Admitting: Internal Medicine

## 2023-03-14 DIAGNOSIS — Z1231 Encounter for screening mammogram for malignant neoplasm of breast: Secondary | ICD-10-CM

## 2023-03-15 ENCOUNTER — Ambulatory Visit (INDEPENDENT_AMBULATORY_CARE_PROVIDER_SITE_OTHER): Payer: 59 | Admitting: Gastroenterology

## 2023-03-15 VITALS — BP 137/100 | HR 72 | Temp 98.0°F | Wt 243.0 lb

## 2023-03-15 DIAGNOSIS — R109 Unspecified abdominal pain: Secondary | ICD-10-CM | POA: Diagnosis not present

## 2023-03-15 DIAGNOSIS — R140 Abdominal distension (gaseous): Secondary | ICD-10-CM

## 2023-03-15 NOTE — Patient Instructions (Addendum)
Your CT scan has been scheduled for December 31st , you must arrive at 9:45am to register. This is at [facility], [address], [address]  You can not have anything to eat or drink after 12 midnight the day before   If you need to cancel or reschedule CT scan, please call scheduling at [phone].
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
## 2023-03-29 ENCOUNTER — Ambulatory Visit
Admission: RE | Admit: 2023-03-29 | Discharge: 2023-03-29 | Disposition: A | Discharge status: Home / Self Care | Payer: 59 | Source: Ambulatory Visit | Attending: Gastroenterology | Admitting: Gastroenterology

## 2023-03-29 DIAGNOSIS — R109 Unspecified abdominal pain: Secondary | ICD-10-CM | POA: Diagnosis present

## 2023-03-29 DIAGNOSIS — R140 Abdominal distension (gaseous): Secondary | ICD-10-CM | POA: Insufficient documentation

## 2023-04-06 ENCOUNTER — Ambulatory Visit
Admission: RE | Admit: 2023-04-06 | Discharge: 2023-04-06 | Disposition: A | Discharge status: Home / Self Care | Payer: 59 | Source: Ambulatory Visit | Attending: Internal Medicine | Admitting: Internal Medicine

## 2023-04-06 DIAGNOSIS — Z1231 Encounter for screening mammogram for malignant neoplasm of breast: Secondary | ICD-10-CM | POA: Insufficient documentation

## 2023-04-11 ENCOUNTER — Encounter: Payer: Self-pay | Admitting: Gastroenterology

## 2023-05-14 ENCOUNTER — Encounter: Payer: Self-pay | Admitting: Cardiovascular Disease

## 2023-05-17 ENCOUNTER — Ambulatory Visit (INDEPENDENT_AMBULATORY_CARE_PROVIDER_SITE_OTHER): Payer: 59 | Admitting: Neurology

## 2023-05-17 ENCOUNTER — Telehealth: Payer: 59 | Admitting: Adult Health

## 2023-05-17 VITALS — BP 117/91 | HR 83 | Ht 67.0 in | Wt 247.4 lb

## 2023-05-17 DIAGNOSIS — F01B3 Vascular dementia, moderate, with mood disturbance: Secondary | ICD-10-CM | POA: Diagnosis not present

## 2023-05-17 MED ORDER — SERTRALINE HCL 25 MG PO TABS: ORAL_TABLET | ORAL | 3 refills | Status: DC
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
## 2023-05-17 NOTE — Patient Instructions (Addendum)
Medications used to treat behavioral changes in dementia include antipsychotics, antidepressants, anticonvulsants, and anxiolytics.  Antipsychotics Aripiprazole, haloperidol, and risperidone: Used to treat hallucinations, paranoia, and severe agitation  Brexpiprazole (Rexulti): Approved to treat agitation associated with Alzheimer's disease  2. Antidepressants Sertraline, citalopram, mirtazapine, and trazodone: Used to treat mood and behavior changes  SSRIs (selective serotonin reuptake inhibitors): Used to treat mood symptoms, including apathy and aggressive impulses  3.  Anticonvulsants  Valproic acid: Used to control agitation, violent behavior, and mood swings  Sertraline Tablets What is this medication? SERTRALINE (SER tra leen)  It increases the amount of serotonin in the brain, a hormone that helps regulate mood. It belongs to a group of medications called SSRIs. This medicine may be used for other purposes; ask your health care provider or pharmacist if you have questions. COMMON BRAND NAME(S): Zoloft What should I tell my care team before I take this medication? They need to know if you have any of these conditions: Bleeding disorders Bipolar disorder or a family history of bipolar disorder Frequently drink alcohol Glaucoma Heart disease High blood pressure History of irregular heartbeat History of low levels of calcium, magnesium, or potassium in the blood Liver disease Receiving electroconvulsive therapy Seizures Suicidal thoughts, plans, or attempt by you or a family member Take medications that prevent or treat blood clots Thyroid disease An unusual or allergic reaction to sertraline, other medications, foods, dyes, or preservatives Pregnant or trying to get pregnant Breastfeeding How should I use this medication? Take this medication by mouth with a glass of water. Take it as directed on the prescription label at the same time every day. You can take it with or  without food. If it upsets your stomach, take it with food. Do not take your medication more often than directed. Keep taking this medication unless your care team tells you to stop. Stopping it too quickly can cause serious side effects. It can also make your condition worse. A special MedGuide will be given to you by the pharmacist with each prescription and refill. Be sure to read this information carefully each time. Talk to your care team about the use of this medication in children. While it may be prescribed for children as young as 7 years for selected conditions, precautions do apply. Overdosage: If you think you have taken too much of this medicine contact a poison control center or emergency room at once. NOTE: This medicine is only for you. Do not share this medicine with others. What if I miss a dose? If you miss a dose, take it as soon as you can. If it is almost time for your next dose, take only that dose. Do not take double or extra doses. What may interact with this medication? Do not take this medication with any of the following: Cisapride Dronedarone Linezolid MAOIs, such as Carbex, Eldepryl, Marplan, Nardil, and Parnate Methylene blue (injected into a vein) Pimozide Thioridazine This medication may also interact with the following: Alcohol Amphetamines Aspirin and aspirin-like medications Certain medications for fungal infections, such as ketoconazole, fluconazole, posaconazole, itraconazole Certain medications for irregular heart beat, such as flecainide, quinidine, propafenone Certain medications for mental health conditions Certain medications for migraine headaches, such as almotriptan, eletriptan, frovatriptan, naratriptan, rizatriptan, sumatriptan, zolmitriptan Certain medications for seizures, such as carbamazepine, valproic acid, phenytoin Certain medications for sleep Certain medications that prevent or treat blood clots, such as warfarin, enoxaparin,  dalteparin Cimetidine Digoxin Diuretics Fentanyl Isoniazid Lithium NSAIDs, medications for pain and inflammation, such as  ibuprofen or naproxen Other medications that cause heart rhythm changes, such as dofetilide Rasagiline Safinamide Supplements, such as St. John's wort, kava kava, valerian Tolbutamide Tramadol Tryptophan This list may not describe all possible interactions. Give your health care provider a list of all the medicines, herbs, non-prescription drugs, or dietary supplements you use. Also tell them if you smoke, drink alcohol, or use illegal drugs. Some items may interact with your medicine. What should I watch for while using this medication? Tell your care team if your symptoms do not get better or if they get worse. Visit your care team for regular checks on your progress. Because it may take several weeks to see the full effects of this medication, it is important to continue your treatment as prescribed by your care team. Patients and their families should watch out for new or worsening thoughts of suicide or depression. Also watch out for sudden changes in feelings such as feeling anxious, agitated, panicky, irritable, hostile, aggressive, impulsive, severely restless, overly excited and hyperactive, or not being able to sleep. If this happens, especially at the beginning of treatment or after a change in dose, call your care team. This medication may affect your coordination, reaction time, or judgment. Do not drive or operate machinery until you know how this medication affects you. Sit or stand up slowly to reduce the risk of dizzy or fainting spells. Drinking alcohol with this medication can increase the risk of these side effects. Your mouth may get dry. Chewing sugarless gum or sucking hard candy, and drinking plenty of water may help. Contact your care team if the problem does not go away or is severe. What side effects may I notice from receiving this  medication? Side effects that you should report to your care team as soon as possible: Allergic reactions--skin rash, itching, hives, swelling of the face, lips, tongue, or throat Bleeding--bloody or black, tar-like stools, red or dark brown urine, vomiting blood or brown material that looks like coffee grounds, small red or purple spots on skin, unusual bleeding or bruising Heart rhythm changes--fast or irregular heartbeat, dizziness, feeling faint or lightheaded, chest pain, trouble breathing Low sodium level--muscle weakness, fatigue, dizziness, headache, confusion Serotonin syndrome--irritability, confusion, fast or irregular heartbeat, muscle stiffness, twitching muscles, sweating, high fever, seizure, chills, vomiting, diarrhea Sudden eye pain or change in vision such as blurred vision, seeing halos around lights, vision loss Thoughts of suicide or self-harm, worsening mood Side effects that usually do not require medical attention (report these to your care team if they continue or are bothersome): Change in sex drive or performance Diarrhea Excessive sweating Nausea Tremors or shaking Upset stomach This list may not describe all possible side effects. Call your doctor for medical advice about side effects. You may report side effects to FDA at 1-800-FDA-1088. Where should I keep my medication? Keep out of the reach of children and pets. Store at room temperature between 20 and 25 degrees C (68 and 77 degrees F). Get rid of any unused medication after the expiration date. To get rid of medications that are no longer needed or expired: Take the medication to a medication take-back program. Check with your pharmacy or law enforcement to find a location. If you cannot return the medication, check the label or package insert to see if the medication should be thrown out in the garbage or flushed down the toilet. If you are not sure, ask your care team. If it is safe to put in the trash,  empty the medication out of the container. Mix the medication with cat litter, dirt, coffee grounds, or other unwanted substance. Seal the mixture in a bag or container. Put it in the trash. NOTE: This sheet is a summary. It may not cover all possible information. If you have questions about this medicine, talk to your doctor, pharmacist, or health care provider.  2024 Elsevier/Gold Standard (2021-10-13 00:00:00)

## 2023-05-23 MED ORDER — POTASSIUM CHLORIDE CRYS ER 10 MEQ PO TBCR: 10.0000 meq | EXTENDED_RELEASE_TABLET | Freq: Every day | ORAL | 3 refills | Status: DC

## 2023-06-06 ENCOUNTER — Other Ambulatory Visit: Payer: Self-pay | Admitting: Nephrology

## 2023-06-06 DIAGNOSIS — N1831 Chronic kidney disease, stage 3a: Secondary | ICD-10-CM

## 2023-06-08 ENCOUNTER — Ambulatory Visit
Admission: RE | Admit: 2023-06-08 | Discharge: 2023-06-08 | Disposition: A | Discharge status: Home / Self Care | Source: Ambulatory Visit | Attending: Nephrology | Admitting: Nephrology

## 2023-06-08 DIAGNOSIS — N1831 Chronic kidney disease, stage 3a: Secondary | ICD-10-CM | POA: Insufficient documentation

## 2023-06-10 ENCOUNTER — Other Ambulatory Visit: Payer: Self-pay | Admitting: Cardiovascular Disease

## 2023-07-22 ENCOUNTER — Telehealth: Payer: Self-pay | Admitting: Cardiovascular Disease

## 2023-07-22 MED ORDER — POTASSIUM CHLORIDE CRYS ER 10 MEQ PO TBCR: 10.0000 meq | EXTENDED_RELEASE_TABLET | Freq: Every day | ORAL | 3 refills | Status: DC

## 2023-07-22 NOTE — Telephone Encounter (Signed)
 Pt c/o medication issue:  1. Name of Medication:   potassium chloride  (KLOR-CON  M) 10 MEQ tablet   2. How are you currently taking this medication (dosage and times per day)?   Daughter stated patient has been taking this medication every day  3. Are you having a reaction (difficulty breathing--STAT)?   No  4. What is your medication issue?   Daughter Halford Levels) stated patient has been taking this medication every day and current prescription is written for every other day.  Daughter wants new prescription with new instructions sent to [facility] [address] [facility] - [place], [place] - [address] AT [address]

## 2023-07-22 NOTE — Telephone Encounter (Signed)
 Refill request sent to preferred pharmacy.

## 2023-08-11 ENCOUNTER — Telehealth: Payer: Self-pay | Admitting: Internal Medicine

## 2023-08-11 NOTE — Telephone Encounter (Signed)
 Ok to schedule.

## 2023-08-11 NOTE — Telephone Encounter (Signed)
  I did not see a note in patient's chart that Dr Geralyn Knee would accept her as a new patient.   Copied from CRM [id number]. Topic: Appointments - Scheduling Inquiry for Clinic >> Aug 11, 2023  2:17 PM Albertha Alosa wrote: Reason for CRM: Patient daughter, Louanna Rouse called in wanting her mom to be scheduled a new patient will Dr.Scott, is asking for someone to give her a callback to get her scheduled callback number is  [phone]

## 2023-08-11 NOTE — Telephone Encounter (Signed)
 See me

## 2023-08-12 NOTE — Telephone Encounter (Signed)
 Please schedule new pt appt and send paperwork or tell her to complete on my chart if able.

## 2023-08-30 ENCOUNTER — Ambulatory Visit: Admitting: Internal Medicine

## 2023-09-01 ENCOUNTER — Ambulatory Visit (INDEPENDENT_AMBULATORY_CARE_PROVIDER_SITE_OTHER): Admitting: Internal Medicine

## 2023-09-01 ENCOUNTER — Encounter: Payer: Self-pay | Admitting: Internal Medicine

## 2023-09-01 VITALS — BP 128/76 | HR 89 | Temp 98.0°F | Resp 16 | Ht 67.0 in | Wt 241.4 lb

## 2023-09-01 DIAGNOSIS — I10 Essential (primary) hypertension: Secondary | ICD-10-CM

## 2023-09-01 DIAGNOSIS — E782 Mixed hyperlipidemia: Secondary | ICD-10-CM

## 2023-09-01 DIAGNOSIS — I480 Paroxysmal atrial fibrillation: Secondary | ICD-10-CM | POA: Diagnosis not present

## 2023-09-01 DIAGNOSIS — R739 Hyperglycemia, unspecified: Secondary | ICD-10-CM | POA: Diagnosis not present

## 2023-09-01 DIAGNOSIS — G301 Alzheimer's disease with late onset: Secondary | ICD-10-CM

## 2023-09-01 DIAGNOSIS — F02818 Dementia in other diseases classified elsewhere, unspecified severity, with other behavioral disturbance: Secondary | ICD-10-CM
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
## 2023-09-04 ENCOUNTER — Other Ambulatory Visit: Payer: Self-pay | Admitting: Cardiovascular Disease

## 2023-09-04 ENCOUNTER — Encounter: Payer: Self-pay | Admitting: Internal Medicine

## 2023-09-04 NOTE — Assessment & Plan Note (Signed)
 Per review of medications, she is currently taking lisinopril  and propranolol  and lasix . Blood pressure as outlined. No changes in medication today. Follow metabolic panel.

## 2023-09-04 NOTE — Assessment & Plan Note (Signed)
Continue lipitor.  Low cholesterol diet and exercise.  Follow lipid panel.

## 2023-09-04 NOTE — Assessment & Plan Note (Signed)
 Continues on eliquis . Rate controlled. Propranolol  as directed. Stable. Followed by Dr Gollan

## 2023-09-04 NOTE — Assessment & Plan Note (Signed)
 Seeing neurology - last f/u 05/17/23. Continues aricept .

## 2023-09-04 NOTE — Assessment & Plan Note (Signed)
 Low-carb diet and exercise.  Follow met b and A1c.

## 2023-09-05 NOTE — Telephone Encounter (Signed)
 Prescription refill request for Eliquis  received. Indication:afib Last office visit:9/24 Scr:1.1  1/25 Age: 89 Weight:109.5  kg  Prescription refilled

## 2023-09-08 ENCOUNTER — Other Ambulatory Visit (INDEPENDENT_AMBULATORY_CARE_PROVIDER_SITE_OTHER)

## 2023-09-08 DIAGNOSIS — E782 Mixed hyperlipidemia: Secondary | ICD-10-CM

## 2023-09-08 DIAGNOSIS — I480 Paroxysmal atrial fibrillation: Secondary | ICD-10-CM | POA: Diagnosis not present

## 2023-09-08 DIAGNOSIS — R739 Hyperglycemia, unspecified: Secondary | ICD-10-CM

## 2023-09-08 LAB — BASIC METABOLIC PANEL WITH GFR
BUN: 20 mg/dL (ref 6–23)
CO2: 31 meq/L (ref 19–32)
Calcium: 9.3 mg/dL (ref 8.4–10.5)
Chloride: 104 meq/L (ref 96–112)
Creatinine, Ser: 1.02 mg/dL (ref 0.40–1.20)
GFR: 48.82 mL/min — ABNORMAL LOW (ref >60.00)
Glucose, Bld: 90 mg/dL (ref 70–99)
Potassium: 4.1 meq/L (ref 3.5–5.1)
Sodium: 142 meq/L (ref 135–145)

## 2023-09-08 LAB — HEPATIC FUNCTION PANEL
ALT: 31 U/L (ref 0–35)
AST: 36 U/L (ref 0–37)
Albumin: 3.7 g/dL (ref 3.5–5.2)
Alkaline Phosphatase: 118 U/L — ABNORMAL HIGH (ref 39–117)
Bilirubin, Direct: 0.2 mg/dL (ref 0.0–0.3)
Total Bilirubin: 1 mg/dL (ref 0.2–1.2)
Total Protein: 6.6 g/dL (ref 6.0–8.3)

## 2023-09-08 LAB — LIPID PANEL
Cholesterol: 130 mg/dL (ref 0–200)
HDL: 44.8 mg/dL (ref >39.00)
LDL Cholesterol: 73 mg/dL (ref 0–99)
NonHDL: 85.05
Total CHOL/HDL Ratio: 3
Triglycerides: 61 mg/dL (ref 0.0–149.0)
VLDL: 12.2 mg/dL (ref 0.0–40.0)

## 2023-09-08 LAB — HEMOGLOBIN A1C: Hgb A1c MFr Bld: 5.5 % (ref 4.6–6.5)

## 2023-09-09 ENCOUNTER — Other Ambulatory Visit: Payer: Self-pay | Admitting: Internal Medicine

## 2023-09-09 ENCOUNTER — Ambulatory Visit: Payer: Self-pay | Admitting: Internal Medicine

## 2023-09-09 DIAGNOSIS — R944 Abnormal results of kidney function studies: Secondary | ICD-10-CM

## 2023-09-09 DIAGNOSIS — R748 Abnormal levels of other serum enzymes: Secondary | ICD-10-CM

## 2023-09-09 NOTE — Progress Notes (Signed)
 Orders placed for labs

## 2023-09-26 ENCOUNTER — Encounter: Payer: Self-pay | Admitting: Neurology

## 2023-09-29 ENCOUNTER — Ambulatory Visit: Payer: Self-pay

## 2023-09-29 NOTE — Telephone Encounter (Signed)
 FYI Only or Action Required?: Action required by provider: request for appointment.  Patient was last seen in primary care on 09/01/2023 by [name], MD. Called Nurse Triage reporting Leg Pain. Symptoms began a week ago. Interventions attempted: Rest, hydration, or home remedies. Symptoms are: gradually worsening.  Triage Disposition: See HCP Within 4 Hours (Or PCP Triage)  Patient/caregiver understands and will follow disposition?: No, refuses disposition  Copied from CRM [id number]. Topic: Clinical - Red Word Triage >> Sep 29, 2023  2:01 PM [place] A wrote: [facility] that prompted transfer to Nurse Triage: Patient's daughter called in patient is having pain in her legs that prevents her from walking Reason for Disposition  [1] Thigh, calf, or ankle swelling AND [2] only 1 side  Answer Assessment - Initial Assessment Questions 1. ONSET: When did the pain start?      Chronic, worse for one week 2. LOCATION: Where is the pain located?      Bilateral  3. PAIN: How bad is the pain?    (Scale 1-10; or mild, moderate, severe)   -  MILD (1-3): doesn't interfere with normal activities    -  MODERATE (4-7): interferes with normal activities (e.g., work or school) or awakens from sleep, limping    -  SEVERE (8-10): excruciating pain, unable to do any normal activities, unable to walk     Moderate, had increased pain when out for a walk today. 4. WORK OR EXERCISE: Has there been any recent work or exercise that involved this part of the body?      Walking 5. CAUSE: What do you think is causing the leg pain?     Unsure 6. OTHER SYMPTOMS: Do you have any other symptoms? (e.g., chest pain, back pain, breathing difficulty, swelling, rash, fever, numbness, weakness)    Denies all other symptoms  Additional info:  1) Left leg is swollen and seems tight for one week. 2) refusing urgent care, insists on office visit, would like to coordinate an OV with her lab appointment on 10/06/23.  Requesting call back from pcp office to work in for acute visit. Please follow up with daughter Lonell [phone]  Protocols used: Leg Pain-A-AH

## 2023-09-29 NOTE — Telephone Encounter (Signed)
 Left leg swelling and pain, tight to the touch, difficulty walking on it. No redness and no warmth. Swelling is from the knee down to her toes. Swelling started about a week ago. It does not go down at night or with elevation. No SOBr, no chest pain. No drainage from the leg. Pt is currently taking lasix  20 mg daily. Pt has refused the UC and would like to be worked in next week.

## 2023-09-29 NOTE — Telephone Encounter (Signed)
 Called and spoke with Lonell, daughter who again refused to take mom to UC today.  Asked if they would be willing to take her if I scheduled an online appointment tomorrow.  First available appointment is at 8:15am at [facility] on [address], scheduled online for an in-person appointment.  Lonell in agreement to take mother tomorrow. She reports that both legs are swollen but the left is painful.  At this time they have her legs elevated in a lift chair.  Encouraged Lonell to take mother to ED if pain increases or she begins to have dyspnea.

## 2023-09-29 NOTE — Telephone Encounter (Signed)
 Needs to be evaluated today if pain and swelling in legs to make sure nothing more acute going on.

## 2023-09-30 ENCOUNTER — Encounter: Payer: Self-pay | Admitting: Emergency Medicine

## 2023-09-30 ENCOUNTER — Ambulatory Visit: Payer: Self-pay

## 2023-09-30 ENCOUNTER — Ambulatory Visit: Admission: EM | Admit: 2023-09-30 | Discharge: 2023-09-30 | Disposition: A | Discharge status: Home / Self Care

## 2023-09-30 DIAGNOSIS — R600 Localized edema: Secondary | ICD-10-CM
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
## 2023-09-30 NOTE — Discharge Instructions (Addendum)
  1. Fluid retention in legs (Primary) - ED EKG performed in UC shows atrial fibrillation, ventricular rate of 82 bpm, EKG similar to previous EKG from last cardiology visit.  No STEMI. - As discussed continue monitoring lower leg swelling for any increase in redness, warmth, pain, as these could be signs of DVT.  - Minimal concern for DVT at this time due to patient taking Eliquis  for atrial fibrillation and minimal erythema, swelling, warmth, pain. - Also continue monitoring for any shortness of breath, chest pain, weakness, dizziness, headache, severe shortness of breath should be evaluated in the emergency department with stat laboratory testing and advanced imaging to evaluate for fluid retention in the lungs secondary to decreased cardiac function. - Follow-up next week with primary care provider for lab work and evaluation of lower extremity swelling and fluid retention. - Continue to monitor any symptoms for change in severity, if any escalation of current symptoms or development of new symptoms follow-up in ER for further evaluation management.

## 2023-09-30 NOTE — ED Triage Notes (Signed)
 Patient complains of bilateral swelling x 4 days. Patient reports left leg hurts worse than right leg. Has not taken anything for pain or swelling.

## 2023-10-06 ENCOUNTER — Other Ambulatory Visit (INDEPENDENT_AMBULATORY_CARE_PROVIDER_SITE_OTHER)

## 2023-10-06 DIAGNOSIS — R748 Abnormal levels of other serum enzymes: Secondary | ICD-10-CM | POA: Diagnosis not present

## 2023-10-06 DIAGNOSIS — R944 Abnormal results of kidney function studies: Secondary | ICD-10-CM | POA: Diagnosis not present

## 2023-10-06 LAB — BASIC METABOLIC PANEL WITH GFR
BUN: 16 mg/dL (ref 6–23)
CO2: 34 meq/L — ABNORMAL HIGH (ref 19–32)
Calcium: 9.3 mg/dL (ref 8.4–10.5)
Chloride: 107 meq/L (ref 96–112)
Creatinine, Ser: 1 mg/dL (ref 0.40–1.20)
GFR: 49.97 mL/min — ABNORMAL LOW (ref >60.00)
Glucose, Bld: 92 mg/dL (ref 70–99)
Potassium: 3.8 meq/L (ref 3.5–5.1)
Sodium: 145 meq/L (ref 135–145)

## 2023-10-06 LAB — HEPATIC FUNCTION PANEL
ALT: 30 U/L (ref 0–35)
AST: 29 U/L (ref 0–37)
Albumin: 3.8 g/dL (ref 3.5–5.2)
Alkaline Phosphatase: 135 U/L — ABNORMAL HIGH (ref 39–117)
Bilirubin, Direct: 0.2 mg/dL (ref 0.0–0.3)
Total Bilirubin: 0.8 mg/dL (ref 0.2–1.2)
Total Protein: 6.6 g/dL (ref 6.0–8.3)

## 2023-10-07 ENCOUNTER — Ambulatory Visit: Payer: Self-pay | Admitting: Internal Medicine

## 2023-10-07 ENCOUNTER — Ambulatory Visit (INDEPENDENT_AMBULATORY_CARE_PROVIDER_SITE_OTHER)

## 2023-10-07 DIAGNOSIS — R748 Abnormal levels of other serum enzymes: Secondary | ICD-10-CM | POA: Diagnosis not present

## 2023-10-07 LAB — GAMMA GT: GGT: 63 U/L — ABNORMAL HIGH (ref 7–51)

## 2023-10-08 ENCOUNTER — Other Ambulatory Visit: Payer: Self-pay | Admitting: Internal Medicine

## 2023-10-08 DIAGNOSIS — R748 Abnormal levels of other serum enzymes: Secondary | ICD-10-CM

## 2023-10-08 NOTE — Progress Notes (Signed)
Order placed for abdominal ultrasound.   

## 2023-10-18 DIAGNOSIS — I10 Essential (primary) hypertension: Secondary | ICD-10-CM | POA: Diagnosis not present

## 2023-10-18 DIAGNOSIS — R600 Localized edema: Secondary | ICD-10-CM | POA: Diagnosis not present

## 2023-10-18 DIAGNOSIS — N1831 Chronic kidney disease, stage 3a: Secondary | ICD-10-CM | POA: Diagnosis not present

## 2023-10-19 ENCOUNTER — Inpatient Hospital Stay
Admission: EM | Admit: 2023-10-19 | Discharge: 2023-10-23 | DRG: 291 | Disposition: A | Discharge status: Home / Self Care | Attending: Internal Medicine | Admitting: Internal Medicine

## 2023-10-19 ENCOUNTER — Other Ambulatory Visit: Payer: Self-pay

## 2023-10-19 ENCOUNTER — Ambulatory Visit
Admission: RE | Admit: 2023-10-19 | Discharge: 2023-10-19 | Disposition: A | Discharge status: Home / Self Care | Source: Ambulatory Visit | Attending: Internal Medicine | Admitting: Internal Medicine

## 2023-10-19 ENCOUNTER — Emergency Department

## 2023-10-19 ENCOUNTER — Encounter: Payer: Self-pay | Admitting: Emergency Medicine

## 2023-10-19 DIAGNOSIS — Z91013 Allergy to seafood: Secondary | ICD-10-CM | POA: Diagnosis not present

## 2023-10-19 DIAGNOSIS — G301 Alzheimer's disease with late onset: Secondary | ICD-10-CM | POA: Diagnosis present

## 2023-10-19 DIAGNOSIS — E876 Hypokalemia: Secondary | ICD-10-CM | POA: Diagnosis not present

## 2023-10-19 DIAGNOSIS — I10 Essential (primary) hypertension: Secondary | ICD-10-CM | POA: Diagnosis present

## 2023-10-19 DIAGNOSIS — Z821 Family history of blindness and visual loss: Secondary | ICD-10-CM

## 2023-10-19 DIAGNOSIS — M170 Bilateral primary osteoarthritis of knee: Secondary | ICD-10-CM | POA: Diagnosis present

## 2023-10-19 DIAGNOSIS — Z90710 Acquired absence of both cervix and uterus: Secondary | ICD-10-CM

## 2023-10-19 DIAGNOSIS — F02818 Dementia in other diseases classified elsewhere, unspecified severity, with other behavioral disturbance: Secondary | ICD-10-CM | POA: Diagnosis present

## 2023-10-19 DIAGNOSIS — Z800 Family history of malignant neoplasm of digestive organs: Secondary | ICD-10-CM

## 2023-10-19 DIAGNOSIS — N1831 Chronic kidney disease, stage 3a: Secondary | ICD-10-CM | POA: Diagnosis present

## 2023-10-19 DIAGNOSIS — Z79899 Other long term (current) drug therapy: Secondary | ICD-10-CM | POA: Diagnosis not present

## 2023-10-19 DIAGNOSIS — D573 Sickle-cell trait: Secondary | ICD-10-CM | POA: Diagnosis present

## 2023-10-19 DIAGNOSIS — I493 Ventricular premature depolarization: Secondary | ICD-10-CM | POA: Diagnosis present

## 2023-10-19 DIAGNOSIS — R748 Abnormal levels of other serum enzymes: Secondary | ICD-10-CM | POA: Insufficient documentation

## 2023-10-19 DIAGNOSIS — Z7901 Long term (current) use of anticoagulants: Secondary | ICD-10-CM | POA: Diagnosis not present

## 2023-10-19 DIAGNOSIS — I4821 Permanent atrial fibrillation: Secondary | ICD-10-CM | POA: Diagnosis present

## 2023-10-19 DIAGNOSIS — Z820 Family history of epilepsy and other diseases of the nervous system: Secondary | ICD-10-CM

## 2023-10-19 DIAGNOSIS — Z6838 Body mass index (BMI) 38.0-38.9, adult: Secondary | ICD-10-CM | POA: Diagnosis not present

## 2023-10-19 DIAGNOSIS — Z832 Family history of diseases of the blood and blood-forming organs and certain disorders involving the immune mechanism: Secondary | ICD-10-CM

## 2023-10-19 DIAGNOSIS — R410 Disorientation, unspecified: Secondary | ICD-10-CM | POA: Diagnosis not present

## 2023-10-19 DIAGNOSIS — I739 Peripheral vascular disease, unspecified: Secondary | ICD-10-CM | POA: Diagnosis present

## 2023-10-19 DIAGNOSIS — R7989 Other specified abnormal findings of blood chemistry: Secondary | ICD-10-CM | POA: Diagnosis present

## 2023-10-19 DIAGNOSIS — I6782 Cerebral ischemia: Secondary | ICD-10-CM | POA: Diagnosis not present

## 2023-10-19 DIAGNOSIS — Z8261 Family history of arthritis: Secondary | ICD-10-CM

## 2023-10-19 DIAGNOSIS — Z833 Family history of diabetes mellitus: Secondary | ICD-10-CM

## 2023-10-19 DIAGNOSIS — I5032 Chronic diastolic (congestive) heart failure: Secondary | ICD-10-CM | POA: Diagnosis not present

## 2023-10-19 DIAGNOSIS — Z801 Family history of malignant neoplasm of trachea, bronchus and lung: Secondary | ICD-10-CM

## 2023-10-19 DIAGNOSIS — Z803 Family history of malignant neoplasm of breast: Secondary | ICD-10-CM | POA: Diagnosis not present

## 2023-10-19 DIAGNOSIS — R4020 Unspecified coma: Secondary | ICD-10-CM | POA: Diagnosis not present

## 2023-10-19 DIAGNOSIS — R4189 Other symptoms and signs involving cognitive functions and awareness: Secondary | ICD-10-CM | POA: Diagnosis present

## 2023-10-19 DIAGNOSIS — I130 Hypertensive heart and chronic kidney disease with heart failure and stage 1 through stage 4 chronic kidney disease, or unspecified chronic kidney disease: Principal | ICD-10-CM | POA: Diagnosis present

## 2023-10-19 DIAGNOSIS — I5033 Acute on chronic diastolic (congestive) heart failure: Secondary | ICD-10-CM | POA: Diagnosis present

## 2023-10-19 DIAGNOSIS — R404 Transient alteration of awareness: Principal | ICD-10-CM | POA: Diagnosis present

## 2023-10-19 DIAGNOSIS — Z83511 Family history of glaucoma: Secondary | ICD-10-CM | POA: Diagnosis not present

## 2023-10-19 DIAGNOSIS — E669 Obesity, unspecified: Secondary | ICD-10-CM | POA: Diagnosis present

## 2023-10-19 DIAGNOSIS — I272 Pulmonary hypertension, unspecified: Secondary | ICD-10-CM | POA: Diagnosis present

## 2023-10-19 DIAGNOSIS — R55 Syncope and collapse: Secondary | ICD-10-CM | POA: Diagnosis not present

## 2023-10-19 DIAGNOSIS — E66812 Obesity, class 2: Secondary | ICD-10-CM | POA: Diagnosis present

## 2023-10-19 DIAGNOSIS — I5030 Unspecified diastolic (congestive) heart failure: Secondary | ICD-10-CM | POA: Diagnosis not present

## 2023-10-19 DIAGNOSIS — Z809 Family history of malignant neoplasm, unspecified: Secondary | ICD-10-CM

## 2023-10-19 DIAGNOSIS — Z96649 Presence of unspecified artificial hip joint: Secondary | ICD-10-CM | POA: Diagnosis present

## 2023-10-19 DIAGNOSIS — R001 Bradycardia, unspecified: Secondary | ICD-10-CM | POA: Diagnosis not present

## 2023-10-19 DIAGNOSIS — E785 Hyperlipidemia, unspecified: Secondary | ICD-10-CM | POA: Diagnosis present

## 2023-10-19 DIAGNOSIS — F05 Delirium due to known physiological condition: Secondary | ICD-10-CM | POA: Diagnosis present

## 2023-10-19 DIAGNOSIS — I499 Cardiac arrhythmia, unspecified: Secondary | ICD-10-CM | POA: Diagnosis not present

## 2023-10-19 DIAGNOSIS — I482 Chronic atrial fibrillation, unspecified: Secondary | ICD-10-CM | POA: Diagnosis not present

## 2023-10-19 DIAGNOSIS — Z8249 Family history of ischemic heart disease and other diseases of the circulatory system: Secondary | ICD-10-CM

## 2023-10-19 LAB — CBC WITH DIFFERENTIAL/PLATELET
Abs Immature Granulocytes: 0.01 K/uL (ref 0.00–0.07)
Basophils Absolute: 0.1 K/uL (ref 0.0–0.1)
Basophils Relative: 1 %
Eosinophils Absolute: 0.1 K/uL (ref 0.0–0.5)
Eosinophils Relative: 2 %
HCT: 34.8 % — ABNORMAL LOW (ref 36.0–46.0)
Hemoglobin: 12.6 g/dL (ref 12.0–15.0)
Immature Granulocytes: 0 %
Lymphocytes Relative: 42 %
Lymphs Abs: 2.5 K/uL (ref 0.7–4.0)
MCH: 31.9 pg (ref 26.0–34.0)
MCHC: 36.2 g/dL — ABNORMAL HIGH (ref 30.0–36.0)
MCV: 88.1 fL (ref 80.0–100.0)
Monocytes Absolute: 0.8 K/uL (ref 0.1–1.0)
Monocytes Relative: 13 %
Neutro Abs: 2.5 K/uL (ref 1.7–7.7)
Neutrophils Relative %: 42 %
Platelets: 190 K/uL (ref 150–400)
RBC: 3.95 MIL/uL (ref 3.87–5.11)
RDW: 15.1 % (ref 11.5–15.5)
WBC: 6 K/uL (ref 4.0–10.5)
nRBC: 0 % (ref 0.0–0.2)

## 2023-10-19 LAB — BRAIN NATRIURETIC PEPTIDE: B Natriuretic Peptide: 496.5 pg/mL — ABNORMAL HIGH (ref 0.0–100.0)

## 2023-10-19 LAB — URINALYSIS, W/ REFLEX TO CULTURE (INFECTION SUSPECTED)
Bacteria, UA: NONE SEEN
Bilirubin Urine: NEGATIVE
Glucose, UA: NEGATIVE mg/dL
Hgb urine dipstick: NEGATIVE
Ketones, ur: NEGATIVE mg/dL
Leukocytes,Ua: NEGATIVE
Nitrite: NEGATIVE
Protein, ur: NEGATIVE mg/dL
Specific Gravity, Urine: 1.012 (ref 1.005–1.030)
pH: 6 (ref 5.0–8.0)

## 2023-10-19 LAB — COMPREHENSIVE METABOLIC PANEL WITH GFR
ALT: 35 U/L (ref 0–44)
AST: 45 U/L — ABNORMAL HIGH (ref 15–41)
Albumin: 3.4 g/dL — ABNORMAL LOW (ref 3.5–5.0)
Alkaline Phosphatase: 106 U/L (ref 38–126)
Anion gap: 10 (ref 5–15)
BUN: 21 mg/dL (ref 8–23)
CO2: 28 mmol/L (ref 22–32)
Calcium: 9.7 mg/dL (ref 8.9–10.3)
Chloride: 104 mmol/L (ref 98–111)
Creatinine, Ser: 0.87 mg/dL (ref 0.44–1.00)
GFR, Estimated: >60 mL/min (ref >60)
Glucose, Bld: 95 mg/dL (ref 70–99)
Potassium: 3.7 mmol/L (ref 3.5–5.1)
Sodium: 142 mmol/L (ref 135–145)
Total Bilirubin: 1.4 mg/dL — ABNORMAL HIGH (ref 0.0–1.2)
Total Protein: 7.2 g/dL (ref 6.5–8.1)

## 2023-10-19 LAB — TROPONIN I (HIGH SENSITIVITY)
Troponin I (High Sensitivity): 16 ng/L (ref <18)
Troponin I (High Sensitivity): 16 ng/L (ref <18)

## 2023-10-19 MED ORDER — OYSTER SHELL CALCIUM/D3 500-5 MG-MCG PO TABS
1.0000 | ORAL_TABLET | Freq: Every day | ORAL | Status: DC
  Administered 2023-10-20 – 2023-10-23 (×4): 1 via ORAL
  Filled 2023-10-19 (×4): qty 1

## 2023-10-19 MED ORDER — KETOTIFEN FUMARATE 0.035 % OP SOLN
1.0000 [drp] | Freq: Two times a day (BID) | OPHTHALMIC | Status: DC | PRN
  Filled 2023-10-19: qty 5

## 2023-10-19 MED ORDER — ATORVASTATIN CALCIUM 20 MG PO TABS
10.0000 mg | ORAL_TABLET | Freq: Every day | ORAL | Status: DC
  Administered 2023-10-19 – 2023-10-22 (×4): 10 mg via ORAL
  Filled 2023-10-19 (×5): qty 1

## 2023-10-19 MED ORDER — ONDANSETRON HCL 4 MG/2ML IJ SOLN: 4.0000 mg | Freq: Three times a day (TID) | INTRAMUSCULAR | Status: DC | PRN

## 2023-10-19 MED ORDER — MEMANTINE HCL 5 MG PO TABS
10.0000 mg | ORAL_TABLET | Freq: Two times a day (BID) | ORAL | Status: DC
  Administered 2023-10-19 – 2023-10-23 (×8): 10 mg via ORAL
  Filled 2023-10-19 (×8): qty 2

## 2023-10-19 MED ORDER — LISINOPRIL 20 MG PO TABS
20.0000 mg | ORAL_TABLET | Freq: Every day | ORAL | Status: DC
  Administered 2023-10-20 – 2023-10-23 (×4): 20 mg via ORAL
  Filled 2023-10-19 (×3): qty 1
  Filled 2023-10-19: qty 2

## 2023-10-19 MED ORDER — HYDRALAZINE HCL 20 MG/ML IJ SOLN: 5.0000 mg | INTRAMUSCULAR | Status: DC | PRN

## 2023-10-19 MED ORDER — ADULT MULTIVITAMIN W/MINERALS CH
1.0000 | ORAL_TABLET | Freq: Every day | ORAL | Status: DC
  Administered 2023-10-20 – 2023-10-23 (×4): 1 via ORAL
  Filled 2023-10-19 (×4): qty 1

## 2023-10-19 MED ORDER — PROPRANOLOL HCL 10 MG PO TABS
10.0000 mg | ORAL_TABLET | Freq: Three times a day (TID) | ORAL | Status: DC
  Administered 2023-10-20 – 2023-10-23 (×10): 10 mg via ORAL
  Filled 2023-10-19 (×13): qty 1

## 2023-10-19 MED ORDER — SODIUM CHLORIDE 0.9 % IV SOLN: INTRAVENOUS | Status: DC

## 2023-10-19 MED ORDER — IBUPROFEN 400 MG PO TABS: 200.0000 mg | ORAL_TABLET | Freq: Four times a day (QID) | ORAL | Status: DC | PRN

## 2023-10-19 MED ORDER — APIXABAN 5 MG PO TABS
5.0000 mg | ORAL_TABLET | Freq: Two times a day (BID) | ORAL | Status: DC
Start: 2023-10-19 — End: 2023-10-23
  Administered 2023-10-19 – 2023-10-23 (×8): 5 mg via ORAL
  Filled 2023-10-19 (×8): qty 1

## 2023-10-19 MED ORDER — GABAPENTIN 100 MG PO CAPS
100.0000 mg | ORAL_CAPSULE | Freq: Two times a day (BID) | ORAL | Status: DC
  Administered 2023-10-19 – 2023-10-23 (×8): 100 mg via ORAL
  Filled 2023-10-19 (×8): qty 1

## 2023-10-19 MED ORDER — DONEPEZIL HCL 5 MG PO TABS
10.0000 mg | ORAL_TABLET | Freq: Every day | ORAL | Status: DC
  Administered 2023-10-19 – 2023-10-22 (×4): 10 mg via ORAL
  Filled 2023-10-19 (×4): qty 2

## 2023-10-19 MED ORDER — OMEGA-3-ACID ETHYL ESTERS 1 G PO CAPS
1000.0000 mg | ORAL_CAPSULE | Freq: Every day | ORAL | Status: DC
  Administered 2023-10-20 – 2023-10-23 (×4): 1000 mg via ORAL
  Filled 2023-10-19 (×4): qty 1

## 2023-10-19 MED ORDER — HALOPERIDOL LACTATE 5 MG/ML IJ SOLN
3.0000 mg | Freq: Once | INTRAMUSCULAR | Status: AC
  Administered 2023-10-19: 3 mg via INTRAVENOUS
  Filled 2023-10-19: qty 1

## 2023-10-19 NOTE — ED Triage Notes (Signed)
 Patient to ED via [facility] from home for unresponsiveness (started at 1430). PT became responsive en route to painful stimuli. Hx dementia, a fib. Pt answering questions and following commands at this time.

## 2023-10-19 NOTE — ED Notes (Signed)
 Assisted pt to and from bedside commode. nadn
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
## 2023-10-19 NOTE — ED Notes (Signed)
 PT to CT at this time.

## 2023-10-19 NOTE — ED Notes (Signed)
Niu, MD at bedside. °

## 2023-10-19 NOTE — ED Provider Notes (Signed)
 [facility] Provider Note    Event Date/Time   First MD Initiated Contact with Patient 10/19/23 1534     (approximate)   History   Unresponsive   HPI  Briana Howe is a 89 year old female with history of CKD, HTN, A-fib on Eliquis , dementia presenting to the emergency department for evaluation of altered mental status.  Per EMS report, patient was found by her family unresponsive around 2:30 PM.  Unknown last known well.  On their arrival around 3 PM, patient remained unresponsive, but en route, patient did begin to wake up to painful stimuli.  EMS had a short transport, unable to obtain blood pressure but did have a palpable pulse.  Patient is able to tell me her name, but was not aware that she was in the hospital.  Does know she is in [place].  Denies pain anywhere.  Additional history obtained from patient's family who presented to bedside.  They report that patient was seen by 1 daughter around 2:50 pm at her baseline.  At 2:55pm, another daughter went to check on the patient to give her 3 PM meds and noticed that she was not waking up to verbal or physical stimuli.  She went to check the patient's pulse and noticed that it was present but faint and her skin felt cool.  She also felt as though the patient was not breathing.  EMS was called.  She estimates that the patient was unresponsive for about 15 to 20 minutes.  No history of similar.     Physical Exam   Triage Vital Signs: ED Triage Vitals  Encounter Vitals Group     BP 10/19/23 1528 (!) 168/100     Girls Systolic BP Percentile --      Girls Diastolic BP Percentile --      Boys Systolic BP Percentile --      Boys Diastolic BP Percentile --      Pulse Rate 10/19/23 1528 (!) 54     Resp 10/19/23 1528 18     Temp 10/19/23 1532 97.7 F (36.5 C)     Temp src --      SpO2 10/19/23 1528 97 %     Weight 10/19/23 1527 245 lb 1.6 oz (111.2 kg)     Height 10/19/23 1527 5' 7 (1.702 m)      Head Circumference --      Peak Flow --      Pain Score 10/19/23 1527 0     Pain Loc --      Pain Education --      Exclude from Growth Chart --     Most recent vital signs: Vitals:   10/19/23 1800 10/19/23 1830  BP: (!) 151/105 (!) 143/86  Pulse: 94 68  Resp: 14 16  Temp:    SpO2: 99% 99%     General: Awake, interactive  CV:  Regular rate, good peripheral perfusion.  Resp:  Unlabored respirations, lungs clear to auscultation Abd:  Nondistended, soft, nontender Neuro:  Keenly aware, thinks that she is 89 years old but is able to tell me she was born in 1934, thinks the month is February, able to blink eyes and squeeze hands, normal horizontal extraocular movements, no visual field loss, normal facial symmetry, no arm or leg motor drift, no limb ataxia, normal sensation, no aphasia, no dysarthria, no inattention. NIH 2, but suspect likely baseline given reported history of dementia    ED Results / Procedures / Treatments  Labs (all labs ordered are listed, but only abnormal results are displayed) Labs Reviewed  CBC WITH DIFFERENTIAL/PLATELET - Abnormal; Notable for the following components:      Result Value   HCT 34.8 (*)    MCHC 36.2 (*)    All other components within normal limits  COMPREHENSIVE METABOLIC PANEL WITH GFR - Abnormal; Notable for the following components:   Albumin 3.4 (*)    AST 45 (*)    Total Bilirubin 1.4 (*)    All other components within normal limits  URINALYSIS, W/ REFLEX TO CULTURE (INFECTION SUSPECTED) - Abnormal; Notable for the following components:   Color, Urine YELLOW (*)    APPearance CLEAR (*)    All other components within normal limits  BRAIN NATRIURETIC PEPTIDE  TROPONIN I (HIGH SENSITIVITY)  TROPONIN I (HIGH SENSITIVITY)     EKG EKG independently reviewed and interpreted by myself demonstrates:  EKG demonstrates A-fib at a rate of 82, QRS 80, QTc 425, no acute ST changes  RADIOLOGY Imaging independently reviewed and  interpreted by myself demonstrates:  CT head without acute bleed  Formal Radiology Read:  CT Head Wo Contrast Result Date: 10/19/2023 CLINICAL DATA:  Mental status change, unknown cause. Unresponsiveness, now improved. EXAM: CT HEAD WITHOUT CONTRAST TECHNIQUE: Contiguous axial images were obtained from the base of the skull through the vertex without intravenous contrast. RADIATION DOSE REDUCTION: This exam was performed according to the departmental dose-optimization program which includes automated exposure control, adjustment of the mA and/or kV according to patient size and/or use of iterative reconstruction technique. COMPARISON:  Head CT and MRI 03/01/2020 FINDINGS: Brain: There is no evidence of an acute infarct, intracranial hemorrhage, mass, midline shift, or extra-axial fluid collection. Cerebral white matter hypodensities are similar to the prior CT and are nonspecific but compatible with mild chronic small vessel ischemic disease. There is mild cerebral atrophy with asymmetrically prominent volume loss in the mesial right temporal lobe. Vascular: Calcified atherosclerosis at the skull base. No hyperdense vessel. Skull: No fracture or suspicious lesion. Sinuses/Orbits: Visualized paranasal sinuses and mastoid air cells are clear. No acute finding in the included portions of the orbits. Other: None. IMPRESSION: 1. No evidence of acute intracranial abnormality. 2. Mild chronic small vessel ischemic disease. Electronically Signed   By: Dasie Hamburg M.D.   On: 10/19/2023 16:44    PROCEDURES:  Critical Care performed: No  Procedures   MEDICATIONS ORDERED IN ED: Medications  ondansetron  (ZOFRAN ) injection 4 mg (has no administration in time range)  hydrALAZINE  (APRESOLINE ) injection 5 mg (has no administration in time range)  ibuprofen  (ADVIL ) tablet 200 mg (has no administration in time range)  0.9 %  sodium chloride  infusion (has no administration in time range)     IMPRESSION / MDM /  ASSESSMENT AND PLAN / ED COURSE  I reviewed the triage vital signs and the nursing notes.  Differential diagnosis includes, but is not limited to, arrhythmia, anemia, TIA, lower suspicion for CVA given no focal deficits currently, intracranial bleed, electrolyte abnormality  Patient's presentation is most consistent with acute presentation with potential threat to life or bodily function.  89 year old female presenting for evaluation of unresponsiveness, awake with overall reassuring neurologic exam on presentation here. Mild bradycardia, hypertension on presentation here.  No family currently at bedside for collateral, will obtain labs, head CT to further evaluate.   Collateral obtained from family as above.  Labs with reassuring CBC, CMP, negative initial troponin.  CT head without acute bleed.  Urine without  evidence of infection.  Patient reassessed and remains readily arousable.  However, concerning clinical history with acute episode of prolonged unresponsiveness without clear precipitant.  Do think further syncopal evaluation is appropriate.  Will reach out to hospitalist team. Clinical Course as of 10/19/23 1840  Wed Oct 19, 2023  8171 Case reviewed with Dr. Hilma.  He will evaluate for anticipated admission. [NR]    Clinical Course User Index [NR] Levander Slate, MD     FINAL CLINICAL IMPRESSION(S) / ED DIAGNOSES   Final diagnoses:  Unresponsive episode     Rx / DC Orders   ED Discharge Orders     None        Note:  This document was prepared using Dragon voice recognition software and may include unintentional dictation errors.   Levander Slate, MD 10/19/23 [phone]

## 2023-10-20 ENCOUNTER — Observation Stay

## 2023-10-20 ENCOUNTER — Observation Stay (HOSPITAL_COMMUNITY)
Admit: 2023-10-20 | Discharge: 2023-10-20 | Disposition: A | Discharge status: Home / Self Care | Attending: Internal Medicine | Admitting: Internal Medicine

## 2023-10-20 DIAGNOSIS — I4821 Permanent atrial fibrillation: Secondary | ICD-10-CM | POA: Diagnosis present

## 2023-10-20 DIAGNOSIS — I6782 Cerebral ischemia: Secondary | ICD-10-CM | POA: Diagnosis not present

## 2023-10-20 DIAGNOSIS — G301 Alzheimer's disease with late onset: Secondary | ICD-10-CM | POA: Diagnosis present

## 2023-10-20 DIAGNOSIS — F05 Delirium due to known physiological condition: Secondary | ICD-10-CM | POA: Diagnosis present

## 2023-10-20 DIAGNOSIS — I493 Ventricular premature depolarization: Secondary | ICD-10-CM | POA: Diagnosis present

## 2023-10-20 DIAGNOSIS — R55 Syncope and collapse: Secondary | ICD-10-CM | POA: Diagnosis present

## 2023-10-20 DIAGNOSIS — I5030 Unspecified diastolic (congestive) heart failure: Secondary | ICD-10-CM

## 2023-10-20 DIAGNOSIS — Z7901 Long term (current) use of anticoagulants: Secondary | ICD-10-CM | POA: Diagnosis not present

## 2023-10-20 DIAGNOSIS — Z79899 Other long term (current) drug therapy: Secondary | ICD-10-CM | POA: Diagnosis not present

## 2023-10-20 DIAGNOSIS — E876 Hypokalemia: Secondary | ICD-10-CM | POA: Diagnosis not present

## 2023-10-20 DIAGNOSIS — M170 Bilateral primary osteoarthritis of knee: Secondary | ICD-10-CM | POA: Diagnosis present

## 2023-10-20 DIAGNOSIS — R9089 Other abnormal findings on diagnostic imaging of central nervous system: Secondary | ICD-10-CM | POA: Diagnosis not present

## 2023-10-20 DIAGNOSIS — I130 Hypertensive heart and chronic kidney disease with heart failure and stage 1 through stage 4 chronic kidney disease, or unspecified chronic kidney disease: Secondary | ICD-10-CM | POA: Diagnosis present

## 2023-10-20 DIAGNOSIS — R4189 Other symptoms and signs involving cognitive functions and awareness: Secondary | ICD-10-CM | POA: Diagnosis not present

## 2023-10-20 DIAGNOSIS — I272 Pulmonary hypertension, unspecified: Secondary | ICD-10-CM | POA: Diagnosis not present

## 2023-10-20 DIAGNOSIS — Z91013 Allergy to seafood: Secondary | ICD-10-CM | POA: Diagnosis not present

## 2023-10-20 DIAGNOSIS — I739 Peripheral vascular disease, unspecified: Secondary | ICD-10-CM | POA: Diagnosis present

## 2023-10-20 DIAGNOSIS — I482 Chronic atrial fibrillation, unspecified: Secondary | ICD-10-CM | POA: Diagnosis not present

## 2023-10-20 DIAGNOSIS — Z6838 Body mass index (BMI) 38.0-38.9, adult: Secondary | ICD-10-CM | POA: Diagnosis not present

## 2023-10-20 DIAGNOSIS — I5033 Acute on chronic diastolic (congestive) heart failure: Secondary | ICD-10-CM | POA: Diagnosis present

## 2023-10-20 DIAGNOSIS — E66812 Obesity, class 2: Secondary | ICD-10-CM | POA: Diagnosis present

## 2023-10-20 DIAGNOSIS — Z8673 Personal history of transient ischemic attack (TIA), and cerebral infarction without residual deficits: Secondary | ICD-10-CM | POA: Diagnosis not present

## 2023-10-20 DIAGNOSIS — G319 Degenerative disease of nervous system, unspecified: Secondary | ICD-10-CM | POA: Diagnosis not present

## 2023-10-20 DIAGNOSIS — Z8249 Family history of ischemic heart disease and other diseases of the circulatory system: Secondary | ICD-10-CM | POA: Diagnosis not present

## 2023-10-20 DIAGNOSIS — Z800 Family history of malignant neoplasm of digestive organs: Secondary | ICD-10-CM | POA: Diagnosis not present

## 2023-10-20 DIAGNOSIS — N1831 Chronic kidney disease, stage 3a: Secondary | ICD-10-CM | POA: Diagnosis present

## 2023-10-20 DIAGNOSIS — I5032 Chronic diastolic (congestive) heart failure: Secondary | ICD-10-CM | POA: Diagnosis not present

## 2023-10-20 DIAGNOSIS — R404 Transient alteration of awareness: Secondary | ICD-10-CM | POA: Diagnosis not present

## 2023-10-20 DIAGNOSIS — E785 Hyperlipidemia, unspecified: Secondary | ICD-10-CM | POA: Diagnosis present

## 2023-10-20 DIAGNOSIS — Z833 Family history of diabetes mellitus: Secondary | ICD-10-CM | POA: Diagnosis not present

## 2023-10-20 DIAGNOSIS — F02818 Dementia in other diseases classified elsewhere, unspecified severity, with other behavioral disturbance: Secondary | ICD-10-CM | POA: Diagnosis present

## 2023-10-20 DIAGNOSIS — Z83511 Family history of glaucoma: Secondary | ICD-10-CM | POA: Diagnosis not present

## 2023-10-20 DIAGNOSIS — Z803 Family history of malignant neoplasm of breast: Secondary | ICD-10-CM | POA: Diagnosis not present

## 2023-10-20 DIAGNOSIS — D573 Sickle-cell trait: Secondary | ICD-10-CM | POA: Diagnosis present

## 2023-10-20 DIAGNOSIS — I10 Essential (primary) hypertension: Secondary | ICD-10-CM | POA: Diagnosis not present

## 2023-10-20 LAB — ECHOCARDIOGRAM COMPLETE
AR max vel: 2.19 cm2
AV Area VTI: 2.12 cm2
AV Area mean vel: 1.93 cm2
AV Mean grad: 3 mmHg
AV Peak grad: 4.4 mmHg
Ao pk vel: 1.05 m/s
Area-P 1/2: 5.97 cm2
Calc EF: 61.1 %
Height: 67 in
S' Lateral: 2.95 cm
Single Plane A2C EF: 60.2 %
Single Plane A4C EF: 60.5 %
Weight: 3921.6 [oz_av]

## 2023-10-20 MED ORDER — FUROSEMIDE 10 MG/ML IJ SOLN
40.0000 mg | Freq: Every day | INTRAMUSCULAR | Status: DC
  Administered 2023-10-20: 40 mg via INTRAVENOUS
  Filled 2023-10-20 (×2): qty 4

## 2023-10-20 MED ORDER — HALOPERIDOL LACTATE 5 MG/ML IJ SOLN
2.0000 mg | Freq: Once | INTRAMUSCULAR | Status: AC
  Administered 2023-10-20: 2 mg via INTRAVENOUS
  Filled 2023-10-20: qty 1

## 2023-10-20 MED ORDER — FUROSEMIDE 40 MG PO TABS
20.0000 mg | ORAL_TABLET | Freq: Every day | ORAL | Status: DC
  Filled 2023-10-20: qty 1

## 2023-10-20 MED ORDER — PERFLUTREN LIPID MICROSPHERE
1.0000 mL | INTRAVENOUS | Status: AC | PRN
  Administered 2023-10-20: 2 mL via INTRAVENOUS

## 2023-10-20 MED ORDER — HALOPERIDOL LACTATE 5 MG/ML IJ SOLN
1.0000 mg | Freq: Once | INTRAMUSCULAR | Status: AC
  Administered 2023-10-20: 1 mg via INTRAVENOUS
  Filled 2023-10-20: qty 1

## 2023-10-20 NOTE — Hospital Course (Addendum)
 Briana Howe is a 89 y.o. female with medical history significant of HTN, HLD, PVD, dCHF, dementia, depression, CKD-3A, obesity, A-fib on Eliquis , sickle cell trait, rectocele, who presents with unresponsiveness.  In the ambulance, they could not measure his heart blood pressure, did find a pulse. Condition improved after 2 hours. Patient was seen by cardiology, has mild volume overload, was started on IV Lasix . Condition continued to improve, leg edema and feet improving.  Discussed with Dr. Gollan, patient is medically stable for discharge.  Will continue torsemide  20 mg twice a day and follow-up with cardiology as outpatient.  Cardiology will mail patient a Zio recorder.

## 2023-10-20 NOTE — Progress Notes (Addendum)
 Progress Note   Patient: Briana Howe FMW:[id number] DOB: 08/23/1932 DOA: 10/19/2023     0 DOS: the patient was seen and examined on 10/20/2023   Brief hospital course: Briana Howe is a 89 y.o. female with medical history significant of HTN, HLD, PVD, dCHF, dementia, depression, CKD-3A, obesity, A-fib on Eliquis , sickle cell trait, rectocele, who presents with unresponsiveness.  In the ambulance, they could not measure his heart blood pressure, did find a pulse. Condition improved after 2 hours.   Principal Problem:   Unresponsiveness Active Problems:   Atrial fibrillation, chronic (HCC)   Hyperlipidemia   Essential hypertension   Chronic diastolic CHF (congestive heart failure) (HCC)   Chronic kidney disease, stage 3a (HCC)   Late onset Alzheimer's dementia with behavioral disturbance (HCC)   Abnormal LFTs   Obesity (BMI 30-39.9)   Moderate pulmonary hypertension (HCC)   Syncope and collapse   Assessment and Plan: Unresponsiveness Likely syncope and collapse.:  Discussed with patient daughter, patient had a sudden onset of loss consciousness for about 2 hours, no seizure observed. In the ambulance, patient pressures were not measurable, but she did have a pulse. By time she arrived in the emergency room, her condition improved, mental status improved, blood pressure was not low.  Etiology of this episode unclear.  Unlikely to have a seizure. With decreased blood pressure, patient could have a syncope episode.  Telemetry so far had no arrhythmia or bradycardia.  Echocardiogram showed normal ejection fraction with indeterminant diastolic function, moderate pulm hypertension. I also obtain MRI to rule out possible TIA versus stroke.  Head CT did not show any acute changes. Patient has been seeing Dr. Gollan in the office, we have been consulted for patient family.   Atrial fibrillation, chronic (HCC):  Heart rate under control, continue beta-blocker and  anticoagulation.    Hyperlipidemia -Lipitor   Essential hypertension Continue beta-blocker and lisinopril .   Chronic diastolic CHF (congestive heart failure) (HCC) Moderate pulm hypertension.:  He has a moderately elevated BNP, but clinically does not seem to be in acute congestive heart failure.  Will restart home dose of Lasix .   Chronic kidney disease, stage 3a (HCC) Renal function still stable   Late onset Alzheimer's dementia with behavioral disturbance (HCC) -Donepezil  and memantine    Abnormal LFTs:  Patient ultrasound performed as outpatient still pending.   Obesity (BMI 30-39.9): Patient has Obesity Class I, with body weight 111.2 Kg and BMI 38.39 kg/m2.  - Encourage losing weight - Exercise and healthy diet       Subjective:  Patient feels well today, she has no complaint today.  She has some confusion at baseline.  Physical Exam: Vitals:   10/20/23 0610 10/20/23 0733 10/20/23 1006 10/20/23 1007  BP:   (!) 164/96   Pulse:   (!) 117   Resp:   (!) 21   Temp: (!) 97.5 F (36.4 C)   97.7 F (36.5 C)  TempSrc: Oral   Oral  SpO2:  100% 98%   Weight:      Height:       General exam: Appears calm and comfortable  Respiratory system: Clear to auscultation. Respiratory effort normal. Cardiovascular system: Irregular. No JVD, murmurs, rubs, gallops or clicks.  Gastrointestinal system: Abdomen is nondistended, soft and nontender. No organomegaly or masses felt. Normal bowel sounds heard. Central nervous system: Alert and oriented x1. No focal neurological deficits. Extremities: 1+ leg edema Skin: No rashes, lesions or ulcers Psychiatry:  Mood & affect appropriate.  Data Reviewed:  Echocardiogram, CT scan results and lab results reviewed.  Family Communication: Daughter updated over the phone  Disposition: Status is: Inpatient Remains inpatient appropriate because: Severity of disease.     Time spent: 50 minutes  Author: Murvin Mana,  MD 10/20/2023 2:10 PM  For on call review [email or web address].

## 2023-10-20 NOTE — Evaluation (Signed)
 Physical Therapy Evaluation Patient Details Name: Baylynn Shifflett Fuerstenberg MRN: [id number] DOB: [birth year] Today's Date: 10/20/2023  History of Present Illness  presented to ER secondary to and admitted for medical work up surrounding unresponsive episode, unclear etiology.  Clinical Impression  Patient resting in bed upon arrival to room; alert and oriented to self, location as hospital.  Follows simple commands, pleasant and cooperative throughout session; notable deficits in [facility] (with patient asking same questions, retelling same story multiple times during session).  Lives with two, very-supportive daughters in two-story home; bed/bathroom on main level.  No steps to enter.  Ambulatory with assist device (SPC vs RW vs 4WRW depending on distance) for household and limited community distances at baseline; denies fall history. Bilat UE/LE strength and ROM grossly symmetrical and WFL; no focal weakness, sensory deficit appreciated.  Denies pain.  Able to complete bed mobility with supervision; sit/stand, basic transfers and gait (140') with RW, cga/close sup.  Demonstrates reciprocal stepping pattern with good step height/length; good cadence and overall gait speed.  Negotiates obstacles, pathways and turns without difficulty.  No buckling or LOB.  Vitals stable and WFL; negative orthostatics with transition to upright and mobility efforts. Would benefit from skilled PT to address above deficits and promote optimal return to PLOF.; recommend post-acute PT follow up as indicated by interdisciplinary care team.          If plan is discharge home, recommend the following: A little help with walking and/or transfers;A little help with bathing/dressing/bathroom   Can travel by private vehicle        Equipment Recommendations  (has equipment needed)  Recommendations for Other Services       Functional Status Assessment Patient has had a recent decline in their functional status and  demonstrates the ability to make significant improvements in function in a reasonable and predictable amount of time.     Precautions / Restrictions Precautions Precautions: Fall Restrictions Weight Bearing Restrictions Per Provider Order: No      Mobility  Bed Mobility Overal bed mobility: Needs Assistance Bed Mobility: Supine to Sit     Supine to sit: Supervision          Transfers Overall transfer level: Needs assistance Equipment used: Rolling walker (2 wheels) Transfers: Sit to/from Stand, Bed to chair/wheelchair/BSC Sit to Stand: Contact guard assist, Supervision Stand pivot transfers: Contact guard assist, Supervision              Ambulation/Gait Ambulation/Gait assistance: Contact guard assist, Supervision Gait Distance (Feet): 140 Feet Assistive device: Rolling walker (2 wheels)         General Gait Details: reciprocal stepping pattern with good step height/length; good cadence and overall gait speed.  Negotiates obstacles, pathways and turns without difficulty.  No buckling or LOB.  Stairs            Wheelchair Mobility     Tilt Bed    Modified Rankin (Stroke Patients Only)       Balance Overall balance assessment: Needs assistance Sitting-balance support: No upper extremity supported, Feet supported Sitting balance-Leahy Scale: Good     Standing balance support: Bilateral upper extremity supported Standing balance-Leahy Scale: Fair                               Pertinent Vitals/Pain Pain Assessment Pain Assessment: No/denies pain    Home Living Family/patient expects to be discharged to:: Private residence Living Arrangements: Children Available Help at  Discharge: Family Type of Home: House Home Access: Level entry       Home Layout: Two level;Able to live on main level with bedroom/bathroom Home Equipment: Rolling Walker (2 wheels);Rollator (4 wheels);Cane - single point      Prior Function Prior Level  of Function : Independent/Modified Independent             Mobility Comments: Sup/mod indep with household and limited community mobilization, intermittent use of SPC, RW, 4WRW as needed (based on distance planned to travel).  Denies fall history.       Extremity/Trunk Assessment   Upper Extremity Assessment Upper Extremity Assessment: Overall WFL for tasks assessed (grossly 4+/5 throughout; no focal weakness, sensory deficit appreciated)    Lower Extremity Assessment Lower Extremity Assessment: Overall WFL for tasks assessed (grossly 4+/5 throughout; no focal weakness, sensory deficit appreciated)       Communication        Cognition Arousal: Alert Behavior During Therapy: WFL for tasks assessed/performed   PT - Cognitive impairments: History of cognitive impairments                       PT - Cognition Comments: Alert and oriented to self, location as hospital; follows simple commands.  Notable deficits in STM and overall awareness/insight; often repeating same story multiple times during session         Cueing       General Comments      Exercises Other Exercises Other Exercises: Orthostatic assessment-see vitals flowsheet for details   Assessment/Plan    PT Assessment Patient needs continued PT services  PT Problem List Decreased activity tolerance;Decreased balance;Decreased mobility;Decreased cognition;Decreased knowledge of use of DME;Decreased safety awareness;Decreased knowledge of precautions       PT Treatment Interventions DME instruction;Gait training;Stair training;Functional mobility training;Therapeutic activities;Therapeutic exercise;Balance training;Cognitive remediation;Patient/family education    PT Goals (Current goals can be found in the Care Plan section)  Acute Rehab PT Goals Patient Stated Goal: to get my shoes and go home PT Goal Formulation: With patient/family Time For Goal Achievement: 11/03/23 Potential to Achieve  Goals: Good    Frequency Min 2X/week     Co-evaluation               AM-PAC PT 6 Clicks Mobility  Outcome Measure Help needed turning from your back to your side while in a flat bed without using bedrails?: None Help needed moving from lying on your back to sitting on the side of a flat bed without using bedrails?: None Help needed moving to and from a bed to a chair (including a wheelchair)?: A Little Help needed standing up from a chair using your arms (e.g., wheelchair or bedside chair)?: A Little Help needed to walk in hospital room?: A Little Help needed climbing 3-5 steps with a railing? : A Little 6 Click Score: 20    End of Session Equipment Utilized During Treatment: Gait belt Activity Tolerance: Patient tolerated treatment well Patient left: in chair;with call bell/phone within reach;with family/visitor present Nurse Communication: Mobility status PT Visit Diagnosis: Muscle weakness (generalized) (M62.81);Difficulty in walking, not elsewhere classified (R26.2)    Time: 8966-8896 PT Time Calculation (min) (ACUTE ONLY): 30 min   Charges:   PT Evaluation $PT Eval Moderate Complexity: 1 Mod   PT General Charges $$ ACUTE PT VISIT: 1 Visit         Julie Nay H. Delores, PT, DPT, NCS 10/20/23, 12:01 PM [phone]

## 2023-10-20 NOTE — Significant Event (Incomplete)
       CROSS COVER NOTE  NAME: Briana Howe MRN: [id number] DOB : Apr 23, 1932 ATTENDING PHYSICIAN: Laurita Pillion, MD    Date of Service   10/20/2023   HPI/Events of Note   Notified by RN patient arrived from ED became very agitated. Heart rate 160's A fib with RVR (known chronic). Received propranolol  earlier today.  Labs reviewed no significant abnormal. No recent mag level EKG in ED afib with RVR rate 82 QTC  425, BNP 496.5, yesterday. Troponin trend normal values. Seen by cardiology. Ongoing daily diuresis. Permanent a fib that has been well controlled on propranolol  TID  Interventions   Assessment/Plan:     10/20/2023    6:45 PM 10/20/2023    5:30 PM 10/20/2023    5:06 PM  Vitals with BMI  Systolic 154 150 839  Diastolic 123 100 78  Pulse 74 90 78   Temp                     98.35F  Check mag level given daily loop diuretics Haldol  1 mg for agitation not effective additional 2 mg given with much improved agitation X X

## 2023-10-20 NOTE — ED Notes (Signed)
 Pt not staying in bed. Taking off VS monitoring equipment. Removed IV. Confused. Stating I need to leave. I've got to check on my daughters. I know they're worried about me. Pt also continued to talk about a mafia in [place]  that she used to know about. Pt alert and only oriented to person. Attempted to redirect multiple times with no success. Caleb Exon, MD notified. Haldol  ordered and administered. Will monitor closely.

## 2023-10-20 NOTE — Evaluation (Signed)
 Occupational Therapy Evaluation Patient Details Name: Briana Howe MRN: [id number] DOB: [birth year] Today's Date: 10/20/2023   History of Present Illness   presented to ER secondary to and admitted for medical work up surrounding unresponsive episode, unclear etiology.     Clinical Impressions Patient presenting with decreased Ind in self care,balance, functional mobility/transfers, endurance, and safety awareness. Patient reports being mod I at baseline with use of RW for mobility. Pt has two supportive daughters present during session. She lives with her children and they perform IADL tasks. She does enjoy baking and does that with her children. Pt stands and ambulates to bathroom with RW and min guard. Pt able to perform clothing management, hygiene, and transfer with supervision- min guard. Patient is in great spirits and motivated to return home when ready. Patient will benefit from acute OT to increase overall independence in the areas of ADLs, functional mobility, and safety awareness in order to safely discharge.     If plan is discharge home, recommend the following:   Direct supervision/assist for financial management;Direct supervision/assist for medications management;Assist for transportation;Assistance with cooking/housework     Functional Status Assessment   Patient has had a recent decline in their functional status and demonstrates the ability to make significant improvements in function in a reasonable and predictable amount of time.     Equipment Recommendations   None recommended by OT      Precautions/Restrictions   Precautions Precautions: Fall     Mobility Bed Mobility               General bed mobility comments: Pt seated in recliner chair and beginning and end of session    Transfers Overall transfer level: Needs assistance Equipment used: Rolling walker (2 wheels) Transfers: Sit to/from Stand, Bed to  chair/wheelchair/BSC Sit to Stand: Contact guard assist, Supervision Stand pivot transfers: Contact guard assist, Supervision                Balance Overall balance assessment: Needs assistance Sitting-balance support: No upper extremity supported, Feet supported Sitting balance-Leahy Scale: Good     Standing balance support: Bilateral upper extremity supported Standing balance-Leahy Scale: Fair                             ADL either performed or assessed with clinical judgement   ADL Overall ADL's : Needs assistance/impaired                         Toilet Transfer: Contact guard assist;Rolling walker (2 wheels)   Toileting- Clothing Manipulation and Hygiene: Contact guard assist       Functional mobility during ADLs: Contact guard assist       Vision Patient Visual Report: No change from baseline              Pertinent Vitals/Pain Pain Assessment Pain Assessment: No/denies pain     Extremity/Trunk Assessment Upper Extremity Assessment Upper Extremity Assessment: Overall WFL for tasks assessed   Lower Extremity Assessment Lower Extremity Assessment: Overall WFL for tasks assessed       Communication Communication Communication: No apparent difficulties   Cognition Arousal: Alert Behavior During Therapy: WFL for tasks assessed/performed Cognition: History of cognitive impairments             OT - Cognition Comments: baseline memory deficits                 Following commands:  Intact       Cueing  General Comments   Cueing Techniques: Verbal cues              Home Living Family/patient expects to be discharged to:: Private residence Living Arrangements: Children Available Help at Discharge: Family Type of Home: House Home Access: Level entry     Home Layout: Two level;Able to live on main level with bedroom/bathroom               Home Equipment: Rolling Walker (2 wheels);Rollator (4  wheels);Cane - single point          Prior Functioning/Environment Prior Level of Function : Independent/Modified Independent             Mobility Comments: Sup/mod indep with household and limited community mobilization, intermittent use of SPC, RW, 4WRW as needed (based on distance planned to travel).  Denies fall history. ADLs Comments: Pt is very active at home with family and enjoys baking with children. She is Ind with self care needs.    OT Problem List: Decreased strength;Decreased safety awareness;Decreased activity tolerance   OT Treatment/Interventions: Self-care/ADL training;Therapeutic activities;Therapeutic exercise;Patient/family education      OT Goals(Current goals can be found in the care plan section)   Acute Rehab OT Goals Patient Stated Goal: to go home OT Goal Formulation: With patient/family Time For Goal Achievement: 11/03/23 Potential to Achieve Goals: Fair ADL Goals Pt Will Perform Grooming: with modified independence;standing Pt Will Transfer to Toilet: with modified independence;ambulating Pt Will Perform Toileting - Clothing Manipulation and hygiene: with modified independence;sit to/from stand Pt Will Perform Tub/Shower Transfer: with modified independence;ambulating   OT Frequency:  Min 2X/week       AM-PAC OT 6 Clicks Daily Activity     Outcome Measure Help from another person eating meals?: None Help from another person taking care of personal grooming?: None Help from another person toileting, which includes using toliet, bedpan, or urinal?: A Little Help from another person bathing (including washing, rinsing, drying)?: A Little Help from another person to put on and taking off regular upper body clothing?: None Help from another person to put on and taking off regular lower body clothing?: A Little 6 Click Score: 21   End of Session Equipment Utilized During Treatment: Rolling walker (2 wheels)  Activity Tolerance: Patient  tolerated treatment well Patient left: with call bell/phone within reach;in chair;with family/visitor present  OT Visit Diagnosis: Unsteadiness on feet (R26.81);Muscle weakness (generalized) (M62.81)                Time: 1111-1130 OT Time Calculation (min): 19 min Charges:  OT General Charges $OT Visit: 1 Visit OT Evaluation $OT Eval Low Complexity: 1 Low OT Treatments $Self Care/Home Management : 8-22 mins  Izetta Claude, MS, OTR/L , CBIS ascom [phone]  10/20/23, 1:53 PM

## 2023-10-20 NOTE — ED Notes (Signed)
 Dr. Gollan messaged and advised the pt was going to be getting lasix  and a purwick may be good for the pt. Purick placement and medication administration delayed due to pt eating her dinner. Pt was advised we would assist her to the bed, place her purwick, and medicate shortly after her meal was complete.
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
## 2023-10-20 NOTE — ED Notes (Signed)
 Purwick was successfully placed for the pt.

## 2023-10-20 NOTE — Progress Notes (Signed)
 Patient agitated throwing self off bed not directable at shift change. Erminio Cone NP notified and charge nurse. Heart rate jumping to the 160s-170s with agitation and yelling occurences. Patient upset and saying she dropped me off here.

## 2023-10-21 DIAGNOSIS — I272 Pulmonary hypertension, unspecified: Secondary | ICD-10-CM | POA: Diagnosis not present

## 2023-10-21 DIAGNOSIS — I4821 Permanent atrial fibrillation: Secondary | ICD-10-CM | POA: Diagnosis not present

## 2023-10-21 DIAGNOSIS — I5033 Acute on chronic diastolic (congestive) heart failure: Secondary | ICD-10-CM | POA: Diagnosis not present

## 2023-10-21 DIAGNOSIS — R404 Transient alteration of awareness: Secondary | ICD-10-CM | POA: Diagnosis not present

## 2023-10-21 DIAGNOSIS — I10 Essential (primary) hypertension: Secondary | ICD-10-CM | POA: Diagnosis not present

## 2023-10-21 DIAGNOSIS — I482 Chronic atrial fibrillation, unspecified: Secondary | ICD-10-CM | POA: Diagnosis not present

## 2023-10-21 LAB — BASIC METABOLIC PANEL WITH GFR
Anion gap: 10 (ref 5–15)
BUN: 16 mg/dL (ref 8–23)
CO2: 30 mmol/L (ref 22–32)
Calcium: 9.3 mg/dL (ref 8.9–10.3)
Chloride: 105 mmol/L (ref 98–111)
Creatinine, Ser: 0.87 mg/dL (ref 0.44–1.00)
GFR, Estimated: >60 mL/min (ref >60)
Glucose, Bld: 102 mg/dL — ABNORMAL HIGH (ref 70–99)
Potassium: 3.3 mmol/L — ABNORMAL LOW (ref 3.5–5.1)
Sodium: 145 mmol/L (ref 135–145)

## 2023-10-21 LAB — MAGNESIUM: Magnesium: 1.9 mg/dL (ref 1.7–2.4)

## 2023-10-21 MED ORDER — MAGNESIUM SULFATE 2 GM/50ML IV SOLN
2.0000 g | Freq: Once | INTRAVENOUS | Status: AC
  Administered 2023-10-21: 2 g via INTRAVENOUS
  Filled 2023-10-21: qty 50

## 2023-10-21 MED ORDER — FUROSEMIDE 10 MG/ML IJ SOLN
40.0000 mg | Freq: Two times a day (BID) | INTRAMUSCULAR | Status: DC
  Administered 2023-10-21 – 2023-10-23 (×5): 40 mg via INTRAVENOUS
  Filled 2023-10-21 (×4): qty 4

## 2023-10-21 MED ORDER — POTASSIUM CHLORIDE CRYS ER 20 MEQ PO TBCR
40.0000 meq | EXTENDED_RELEASE_TABLET | ORAL | Status: AC
  Administered 2023-10-21 (×2): 40 meq via ORAL
  Filled 2023-10-21 (×2): qty 2

## 2023-10-21 MED ORDER — HALOPERIDOL LACTATE 5 MG/ML IJ SOLN
2.5000 mg | Freq: Once | INTRAMUSCULAR | Status: AC
  Administered 2023-10-21: 2.5 mg via INTRAVENOUS
  Filled 2023-10-21: qty 1

## 2023-10-21 MED ORDER — POTASSIUM CHLORIDE CRYS ER 20 MEQ PO TBCR: 40.0000 meq | EXTENDED_RELEASE_TABLET | Freq: Once | ORAL | Status: DC

## 2023-10-21 NOTE — Progress Notes (Signed)
 Progress Note  Patient Name: Briana Howe Date of Encounter: 10/21/2023 [facility] Cardiologist: Timothy Gollan, MD   Interval Summary    Patient denies an pre-syncope or syncope. She has little memory of what brought her to the hospital. Bps look good. Tele shows rate controlled Afib for the most part.   Vital Signs Vitals:   10/20/23 1845 10/20/23 1950 10/21/23 0439 10/21/23 0803  BP: (!) 154/123 (!) 116/100 (!) 145/90 138/89  Pulse: 74 72 75 86  Resp: 18  16   Temp: 98.6 F (37 C) 97.6 F (36.4 C) 98.3 F (36.8 C) (!) 97.5 F (36.4 C)  TempSrc: Oral Oral Oral Oral  SpO2: 95%   99%  Weight:      Height:       No intake or output data in the 24 hours ending 10/21/23 0839    10/19/2023    3:27 PM 09/01/2023    3:29 PM 05/17/2023    1:52 PM  Last 3 Weights  Weight (lbs) 245 lb 1.6 oz 241 lb 6.4 oz 247 lb 6.4 oz  Weight (kg) 111.177 kg 109.498 kg 112.22 kg      Telemetry/ECG  Afib HR 60-70s, tachycardic yesterday - Personally Reviewed  Physical Exam  GEN: No acute distress.   Neck: + JVD Cardiac: Irreg Irreg, no murmurs, rubs, or gallops.  Respiratory: crackles at bases. GI: Soft, nontender, non-distended  MS: mild lower leg edema  CV studies: Echo 10/20/23 1. Left ventricular ejection fraction, by estimation, is 60 to 65%. The  left ventricle has normal function. The left ventricle has no regional  wall motion abnormalities. Left ventricular diastolic parameters are  indeterminate.   2. Right ventricular systolic function is normal. The right ventricular  size is normal. There is moderately elevated pulmonary artery systolic  pressure. The estimated right ventricular systolic pressure is 50.4 mmHg.   3. Left atrial size was mildly dilated.   4. The mitral valve is normal in structure. No evidence of mitral valve  regurgitation. No evidence of mitral stenosis.   5. Tricuspid valve regurgitation is mild to moderate.   6. The aortic valve  is normal in structure. Aortic valve regurgitation is  not visualized. No aortic stenosis is present.   7. The inferior vena cava is normal in size with <50% respiratory  variability, suggesting right atrial pressure of 8 mmHg.   8. Rhythm is atrial fibrillation, rate 70 to 80 bpm   Assessment & Plan   Unresponsiveness - unclear etiology, patient was unarousable at home for 20 minutes, found by her daughter. Pulse appreciated but unable to measure BP - in the ER BP was present - EKG showed permanent Afib - tele unremarkable - head MRI without stroke - patient with baseline dementia - orthostatics negative - echo showed LVEF 60-65%, no WMA, normal RV function, mild to mod TR - she denies pre-syncope or syncope - plan for heart monitor at d/c  HTN - restarted on propranolol  10mg  TID - lisinopril  20mg  daily - IV lasix   - BP this AM 138/89  Diastolic CHF - PTA lasix  20mg  daily - BNP 496 - IV lasix  40mg  daily - still volume up on exam - I/OS not recorded - echo showed LVEF 60-65% - increase IV lasix  to 40mg  BID  Permanent Afib - continue PTA Eliquis . She reports compliance with Eliquis . - PTA propranolol  10mg  TID for rate control    For questions or updates, please contact [facility] Please consult [email or web address]  for contact info under       Signed, Alando Colleran VEAR Fishman, PA-C

## 2023-10-21 NOTE — Progress Notes (Signed)
 PT attempt. Per chart review and family at bedside,she had a long sleepless night with some agitation/ behaviors observed. Currently pt is sitting in recliner sleeping soundly. Elected not to awake pt and allow time to rest as reequested. Acute PT will continue to follow and progress per current POC.

## 2023-10-21 NOTE — Plan of Care (Signed)

## 2023-10-21 NOTE — Progress Notes (Signed)
 Pt with increased confusion refused labs. She took off her telemetry leads and box. Briana Howe made aware. Patient wanted to go check on her daughters and get dressed. Attempted to reorient the patient. Allowed the patient to walk around the hallway and talked the patient. Snacks offered and she returned to her room.Put her back on telemetry.

## 2023-10-21 NOTE — TOC Initial Note (Signed)
 Transition of Care [facility]) - Initial/Assessment Note    Patient Details  Name: Briana Howe MRN: [id number] Date of Birth: 05/18/1932  Transition [facility]) CM/SW Contact:    Dalia GORMAN Fuse, RN Phone Number: 10/21/2023, 3:15 PM  Clinical Narrative:                  TOC met with the patient and her family in the room. The patient is from home with her daughters. The patient is able to drive, but her daughters take her where she needs to go. Her PCP is Dr. Lenon and she uses [facility]. She has DME: [employer], [employer] (physical sciences).  The plan is for the patient to dc home with health. The patient and family chooses [facility].   Expected Discharge Plan: Home w Home Health Services Barriers to Discharge: Continued Medical Work up   Patient Goals and CMS Choice     Choice offered to / list presented to : Patient      Expected Discharge Plan and Services   Discharge Planning Services: CM Consult Post Acute Care Choice: Home Health Living arrangements for the past 2 months: Single Family Home                                      Prior Living Arrangements/Services Living arrangements for the past 2 months: Single Family Home Lives with:: Adult Children                   Activities of Daily Living   ADL Screening (condition at time of admission) Independently performs ADLs?: Yes (appropriate for developmental age) Is the patient deaf or have difficulty hearing?: No Does the patient have difficulty seeing, even when wearing glasses/contacts?: No Does the patient have difficulty concentrating, remembering, or making decisions?: Yes  Permission Sought/Granted                  Emotional Assessment              Admission diagnosis:  Syncope and collapse [R55] Syncope [R55] Unresponsive episode [R40.4] Patient Active Problem List   Diagnosis Date Noted   Moderate pulmonary hypertension (HCC) 10/20/2023   Syncope and collapse 10/20/2023    Unresponsive episode 10/19/2023   Acute on chronic diastolic CHF (congestive heart failure) (HCC) 10/19/2023   Chronic kidney disease, stage 3a (HCC) 10/19/2023   Obesity (BMI 30-39.9) 10/19/2023   Atrial fibrillation, chronic (HCC) 10/19/2023   Abnormal LFTs 10/19/2023   Hyperglycemia 09/01/2023   Atrial fibrillation with RVR (HCC) 06/07/2021   Late onset Alzheimer's dementia with behavioral disturbance (HCC) 07/22/2020   New onset atrial flutter (HCC)    Dizziness    Afib (HCC) 03/01/2020   Leg pain 01/10/2017   PAD (peripheral artery disease) (HCC) 01/10/2017   Menopause 07/08/2015   Status post TAH-BSO 07/08/2015   Rectocele 07/08/2015   Increased BMI 07/08/2015   Diverticulosis 07/08/2015   PAC (premature atrial contraction) 12/13/2013   Hyperlipidemia 12/13/2013   Morbid obesity (HCC) 12/13/2013   Essential hypertension 12/13/2013   Arthritis, senescent 12/13/2013   PCP:  [name], MD Pharmacy:   [facility] [address] [facility] - [place], [place] - [address] AT [facility] [address] [place] [address] Phone: [phone] Fax: [phone]     Social Drivers of Health (SDOH) Social History: SDOH Screenings   Food Insecurity: No Food Insecurity (10/20/2023)  Housing: Low Risk  (10/20/2023)  Transportation Needs: No Transportation Needs (10/20/2023)  Utilities: Not At Risk (10/20/2023)  Depression (PHQ2-9): Low Risk  (09/01/2023)  Financial Resource Strain: Low Risk  (08/24/2023)  Social Connections: Moderately Integrated (10/20/2023)  Stress: No Stress Concern Present (08/24/2023)  Tobacco Use: Low Risk  (10/19/2023)   SDOH Interventions:     Readmission Risk Interventions     No data to display

## 2023-10-21 NOTE — Progress Notes (Signed)
 Progress Note   Patient: Briana Howe FMW:[id number] DOB: Feb 08, 1933 DOA: 10/19/2023     1 DOS: the patient was seen and examined on 10/21/2023   Brief hospital course: Briana Howe is a 89 y.o. female with medical history significant of HTN, HLD, PVD, dCHF, dementia, depression, CKD-3A, obesity, A-fib on Eliquis , sickle cell trait, rectocele, who presents with unresponsiveness.  In the ambulance, they could not measure his heart blood pressure, did find a pulse. Condition improved after 2 hours. Patient was seen by cardiology, has mild volume overload, was started on IV Lasix .   Principal Problem:   Unresponsive episode Active Problems:   Atrial fibrillation, chronic (HCC)   Hyperlipidemia   Essential hypertension   Chronic diastolic CHF (congestive heart failure) (HCC)   Chronic kidney disease, stage 3a (HCC)   Late onset Alzheimer's dementia with behavioral disturbance (HCC)   Abnormal LFTs   Obesity (BMI 30-39.9)   Moderate pulmonary hypertension (HCC)   Syncope and collapse   Assessment and Plan:  Unresponsiveness Likely syncope and collapse.:  Discussed with patient daughter, patient had a sudden onset of loss consciousness for about 2 hours, no seizure observed. In the ambulance, patient pressures were not measurable, but she did have a pulse. By time she arrived in the emergency room, her condition improved, mental status improved, blood pressure was not low.   Etiology of this episode unclear.  Unlikely to have a seizure. With decreased blood pressure, patient could have a syncope episode.  Telemetry so far had no arrhythmia or bradycardia.  Echocardiogram showed normal ejection fraction with indeterminant diastolic function, moderate pulm hypertension. MRI of the brain did not show a stroke, showed diffuse atrophy. Seen by cardiology, planning for ZIO recorder for 2 weeks at discharge. Condition has improved.   Atrial fibrillation, chronic  (HCC):  Heart rate under control, continue beta-blocker and anticoagulation.     Hyperlipidemia -Lipitor   Essential hypertension Continue beta-blocker and lisinopril .   Acute on Chronic diastolic CHF (congestive heart failure) (HCC) POA Moderate pulm hypertension.:  Patient seen by cardiology, deemed significant volume overload.  Patient had acute on chronic diastolic congestive heart failure at time of admission.  Lasix  was started since yesterday.  She is diuresing well, she does not have any short of breath.  Continue IV Lasix , monitor renal function and electrolytes.   Chronic kidney disease, stage 3a (HCC) Hypokalemia. Renal function still stable on Lasix .  Repeat potassium   Late onset Alzheimer's dementia with behavioral disturbance (HCC) -Donepezil  and memantine    Abnormal LFTs:  Patient ultrasound performed as outpatient still pending.   Obesity (BMI 30-39.9): Patient has Obesity Class II, with body weight 111.2 Kg and BMI 38.39 kg/m2.  - Encourage losing weight - Exercise and healthy diet            Subjective:  Slept well last night, has some baseline confusion.  No agitation.  No short of breath.  Physical Exam: Vitals:   10/20/23 1950 10/21/23 0439 10/21/23 0803 10/21/23 1206  BP: (!) 116/100 (!) 145/90 138/89 (!) 137/94  Pulse: 72 75 86 (!) 107  Resp:  16  19  Temp: 97.6 F (36.4 C) 98.3 F (36.8 C) (!) 97.5 F (36.4 C) 97.8 F (36.6 C)  TempSrc: Oral Oral Oral   SpO2:   99% 95%  Weight:      Height:       General exam: Appears calm and comfortable  Respiratory system: Clear to auscultation. Respiratory effort normal. Cardiovascular system:  Appear regular. No JVD, murmurs, rubs, gallops or clicks.  Gastrointestinal system: Abdomen is nondistended, soft and nontender. No organomegaly or masses felt. Normal bowel sounds heard. Central nervous system: Alert and oriented x2. No focal neurological deficits. Extremities: 1+ leg edema Skin: No  rashes, lesions or ulcers Psychiatry: Mood & affect appropriate.    Data Reviewed:  Lab results reviewed.  Family Communication: Daughter updated over phone  Disposition: Status is: Inpatient Remains inpatient appropriate because:      Time spent: 55 minutes  Author: Murvin Mana, MD 10/21/2023 12:44 PM  For on call review [email or web address].

## 2023-10-22 DIAGNOSIS — I482 Chronic atrial fibrillation, unspecified: Secondary | ICD-10-CM | POA: Diagnosis not present

## 2023-10-22 DIAGNOSIS — I5030 Unspecified diastolic (congestive) heart failure: Secondary | ICD-10-CM | POA: Diagnosis not present

## 2023-10-22 DIAGNOSIS — I10 Essential (primary) hypertension: Secondary | ICD-10-CM | POA: Diagnosis not present

## 2023-10-22 DIAGNOSIS — R404 Transient alteration of awareness: Secondary | ICD-10-CM | POA: Diagnosis not present

## 2023-10-22 DIAGNOSIS — I4821 Permanent atrial fibrillation: Secondary | ICD-10-CM | POA: Diagnosis not present

## 2023-10-22 DIAGNOSIS — I5033 Acute on chronic diastolic (congestive) heart failure: Secondary | ICD-10-CM | POA: Diagnosis not present

## 2023-10-22 LAB — BASIC METABOLIC PANEL WITH GFR
Anion gap: 7 (ref 5–15)
Anion gap: 8 (ref 5–15)
BUN: 16 mg/dL (ref 8–23)
BUN: 16 mg/dL (ref 8–23)
CO2: 31 mmol/L (ref 22–32)
CO2: 30 mmol/L (ref 22–32)
Calcium: 9.3 mg/dL (ref 8.9–10.3)
Calcium: 9.4 mg/dL (ref 8.9–10.3)
Chloride: 102 mmol/L (ref 98–111)
Chloride: 104 mmol/L (ref 98–111)
Creatinine, Ser: 0.87 mg/dL (ref 0.44–1.00)
Creatinine, Ser: 0.98 mg/dL (ref 0.44–1.00)
GFR, Estimated: >60 mL/min (ref >60)
GFR, Estimated: 55 mL/min — ABNORMAL LOW (ref >60)
Glucose, Bld: 102 mg/dL — ABNORMAL HIGH (ref 70–99)
Glucose, Bld: 105 mg/dL — ABNORMAL HIGH (ref 70–99)
Potassium: 3.5 mmol/L (ref 3.5–5.1)
Potassium: 4 mmol/L (ref 3.5–5.1)
Sodium: 140 mmol/L (ref 135–145)
Sodium: 142 mmol/L (ref 135–145)

## 2023-10-22 LAB — MAGNESIUM: Magnesium: 2 mg/dL (ref 1.7–2.4)

## 2023-10-22 MED ORDER — POTASSIUM CHLORIDE CRYS ER 20 MEQ PO TBCR
40.0000 meq | EXTENDED_RELEASE_TABLET | Freq: Once | ORAL | Status: AC
  Administered 2023-10-22: 40 meq via ORAL
  Filled 2023-10-22: qty 2

## 2023-10-22 NOTE — Plan of Care (Signed)
  Problem: Education: Goal: Knowledge of General Education information will improve Description: Including pain rating scale, medication(s)/side effects and non-pharmacologic comfort measures Outcome: Progressing   Problem: Clinical Measurements: Goal: Ability to maintain clinical measurements within normal limits will improve Outcome: Progressing   Problem: Activity: Goal: Risk for activity intolerance will decrease Outcome: Progressing   Problem: Nutrition: Goal: Adequate nutrition will be maintained Outcome: Progressing   Problem: Coping: Goal: Level of anxiety will decrease Outcome: Progressing   Problem: Elimination: Goal: Will not experience complications related to bowel motility Outcome: Progressing   Problem: Pain Managment: Goal: General experience of comfort will improve and/or be controlled Outcome: Progressing   Problem: Safety: Goal: Ability to remain free from injury will improve Outcome: Progressing   Problem: Skin Integrity: Goal: Risk for impaired skin integrity will decrease Outcome: Progressing

## 2023-10-22 NOTE — Plan of Care (Signed)
   Problem: Activity: Goal: Risk for activity intolerance will decrease Outcome: Progressing

## 2023-10-22 NOTE — Progress Notes (Signed)
 Progress Note  Patient Name: Briana Howe Date of Encounter: 10/22/2023 [facility] Cardiologist: Evalene Lunger, MD   Interval Summary    Kidney function stable. Unsure I/Os accurate. She required a sitter overnight for sundowning. She denies dizziness/lightheadedness. No chest pain or SOB.   Vital Signs Vitals:   10/21/23 1206 10/21/23 1920 10/22/23 0500 10/22/23 0517  BP: (!) 137/94 133/80  115/84  Pulse: (!) 107 100  (!) 104  Resp: 19 16  18   Temp: 97.8 F (36.6 C) 98.4 F (36.9 C)  (!) 97.5 F (36.4 C)  TempSrc:      SpO2: 95% 98%  98%  Weight:   111.4 kg   Height:        Intake/Output Summary (Last 24 hours) at 10/22/2023 0724 Last data filed at 10/21/2023 1953 Gross per 24 hour  Intake 960 ml  Output 1500 ml  Net -540 ml      10/22/2023    5:00 AM 10/19/2023    3:27 PM 09/01/2023    3:29 PM  Last 3 Weights  Weight (lbs) 245 lb 9.5 oz 245 lb 1.6 oz 241 lb 6.4 oz  Weight (kg) 111.4 kg 111.177 kg 109.498 kg      Telemetry/ECG  Afib HR 80-90s occasional tachycardia up to 160 - Personally Reviewed  Physical Exam  GEN: No acute distress.   Neck: No JVD Cardiac: Irreg Irreg, no murmurs, rubs, or gallops.  Respiratory: Clear to auscultation bilaterally. GI: Soft, nontender, non-distended  MS: 1+ lower leg edema  CV studies:  Echo 10/20/23 1. Left ventricular ejection fraction, by estimation, is 60 to 65%. The  left ventricle has normal function. The left ventricle has no regional  wall motion abnormalities. Left ventricular diastolic parameters are  indeterminate.   2. Right ventricular systolic function is normal. The right ventricular  size is normal. There is moderately elevated pulmonary artery systolic  pressure. The estimated right ventricular systolic pressure is 50.4 mmHg.   3. Left atrial size was mildly dilated.   4. The mitral valve is normal in structure. No evidence of mitral valve  regurgitation. No evidence of mitral  stenosis.   5. Tricuspid valve regurgitation is mild to moderate.   6. The aortic valve is normal in structure. Aortic valve regurgitation is  not visualized. No aortic stenosis is present.   7. The inferior vena cava is normal in size with <50% respiratory  variability, suggesting right atrial pressure of 8 mmHg.   8. Rhythm is atrial fibrillation, rate 70 to 80 bpm     Assessment & Plan  Unresponsiveness - unclear etiology, patient was unarousable at home for 20 minutes, found by her daughter. Pulse appreciated but unable to measure BP - in the ER BP was present - EKG showed permanent Afib - tele unremarkable - head MRI without stroke - patient with baseline dementia - orthostatics negative - echo showed LVEF 60-65%, no WMA, normal RV function, mild to mod TR - she denies pre-syncope or syncope - plan for heart monitor at d/c   HTN - restarted on propranolol  10mg  TID - lisinopril  20mg  daily - IV lasix   - BP this AM 115/84   Diastolic CHF - PTA lasix  20mg  daily - BNP 496 - IV lasix  40mg  BID - still volume up on exam - I/OS record - - echo showed LVEF 60-65% - may require one more day of IV lasix   Permanent Afib - continue PTA Eliquis . She reports compliance with Eliquis . - PTA  propranolol  10mg  TID for rate control     For questions or updates, please contact [facility] Please consult [email or web address] for contact info under       Signed, Mazikeen Hehn VEAR Fishman, PA-C

## 2023-10-22 NOTE — Progress Notes (Signed)
 Progress Note   Patient: Briana Howe FMW:[id number] DOB: 05-03-1932 DOA: 10/19/2023     2 DOS: the patient was seen and examined on 10/22/2023   Brief hospital course: Dymond Gutt Flater is a 89 y.o. female with medical history significant of HTN, HLD, PVD, dCHF, dementia, depression, CKD-3A, obesity, A-fib on Eliquis , sickle cell trait, rectocele, who presents with unresponsiveness.  In the ambulance, they could not measure his heart blood pressure, did find a pulse. Condition improved after 2 hours. Patient was seen by cardiology, has mild volume overload, was started on IV Lasix .   Principal Problem:   Unresponsive episode Active Problems:   Atrial fibrillation, chronic (HCC)   Hyperlipidemia   Essential hypertension   Acute on chronic diastolic CHF (congestive heart failure) (HCC)   Chronic kidney disease, stage 3a (HCC)   Late onset Alzheimer's dementia with behavioral disturbance (HCC)   Abnormal LFTs   Obesity (BMI 30-39.9)   Moderate pulmonary hypertension (HCC)   Syncope and collapse   Assessment and Plan: Unresponsiveness Likely syncope and collapse.:  Discussed with patient daughter, patient had a sudden onset of loss consciousness for about 2 hours, no seizure observed. In the ambulance, patient pressures were not measurable, but she did have a pulse. By time she arrived in the emergency room, her condition improved, mental status improved, blood pressure was not low.   Etiology of this episode unclear.  Unlikely to have a seizure. With decreased blood pressure, patient could have a syncope episode.  Telemetry so far had no arrhythmia or bradycardia.  Echocardiogram showed normal ejection fraction with indeterminant diastolic function, moderate pulm hypertension. MRI of the brain did not show a stroke, showed diffuse atrophy. Seen by cardiology, planning for ZIO recorder for 2 weeks at discharge. Condition has improved.   Atrial fibrillation,  chronic (HCC):  Heart rate under control, continue beta-blocker and anticoagulation.     Hyperlipidemia -Lipitor   Essential hypertension Continue beta-blocker and lisinopril .   Acute on Chronic diastolic CHF (congestive heart failure) (HCC) POA Moderate pulm hypertension.:  Patient seen by cardiology, deemed significant volume overload.  Patient had acute on chronic diastolic congestive heart failure at time of admission.  Lasix  is started.  Still has significant edema today, lungs becomes clear, discussed with cardiology and patient daughter, decision was made to continue IV Lasix  for another day.  Plan to discharge tomorrow.   Chronic kidney disease, stage 3a (HCC) Hypokalemia. Renal function still stable on Lasix .  Potassium was 3.5, give another dose of oral potassium.   Late onset Alzheimer's dementia with behavioral disturbance (HCC) -Donepezil  and memantine    Abnormal LFTs:  Patient ultrasound performed as outpatient still pending.   Obesity (BMI 30-39.9): Patient has Obesity Class II, with body weight 111.2 Kg and BMI 38.39 kg/m2.  - Encourage losing weight - Exercise and healthy diet            Subjective:  Patient has baseline confusion with occasional agitation requiring sitter. Denies any shortness of breath  Physical Exam: Vitals:   10/21/23 1920 10/22/23 0500 10/22/23 0517 10/22/23 0758  BP: 133/80  115/84 138/87  Pulse: 100  (!) 104 66  Resp: 16  18 18   Temp: 98.4 F (36.9 C)  (!) 97.5 F (36.4 C) 97.7 F (36.5 C)  TempSrc:      SpO2: 98%  98% 98%  Weight:  111.4 kg    Height:       General exam: Appears calm and comfortable  Respiratory system: Clear  to auscultation. Respiratory effort normal. Cardiovascular system: S1 & S2 heard, RRR. No JVD, murmurs, rubs, gallops or clicks. Gastrointestinal system: Abdomen is nondistended, soft and nontender. No organomegaly or masses felt. Normal bowel sounds heard. Central nervous system: Alert and  oriented x2. No focal neurological deficits. Extremities: Leg Edema improving. Skin: No rashes, lesions or ulcers Psychiatry: Mood & affect appropriate.     Data Reviewed:  Lab results reviewed.  Family Communication: Daughter updated over the phone.  Disposition: Status is: Inpatient Remains inpatient appropriate because: Severity of disease, IV treatment     Time spent: 35 minutes  Author: Murvin Mana, MD 10/22/2023 10:45 AM  For on call review [email or web address].

## 2023-10-22 NOTE — Plan of Care (Signed)
  Problem: Nutrition: Goal: Adequate nutrition will be maintained Outcome: Progressing   Problem: Activity: Goal: Risk for activity intolerance will decrease Outcome: Progressing   

## 2023-10-23 DIAGNOSIS — I10 Essential (primary) hypertension: Secondary | ICD-10-CM | POA: Diagnosis not present

## 2023-10-23 DIAGNOSIS — I5030 Unspecified diastolic (congestive) heart failure: Secondary | ICD-10-CM | POA: Diagnosis not present

## 2023-10-23 DIAGNOSIS — I5033 Acute on chronic diastolic (congestive) heart failure: Secondary | ICD-10-CM | POA: Diagnosis not present

## 2023-10-23 DIAGNOSIS — I4821 Permanent atrial fibrillation: Secondary | ICD-10-CM | POA: Diagnosis not present

## 2023-10-23 DIAGNOSIS — R404 Transient alteration of awareness: Secondary | ICD-10-CM | POA: Diagnosis not present

## 2023-10-23 DIAGNOSIS — I482 Chronic atrial fibrillation, unspecified: Secondary | ICD-10-CM | POA: Diagnosis not present

## 2023-10-23 LAB — BASIC METABOLIC PANEL WITH GFR
Anion gap: 8 (ref 5–15)
BUN: 17 mg/dL (ref 8–23)
CO2: 29 mmol/L (ref 22–32)
Calcium: 9.3 mg/dL (ref 8.9–10.3)
Chloride: 104 mmol/L (ref 98–111)
Creatinine, Ser: 0.92 mg/dL (ref 0.44–1.00)
GFR, Estimated: 60 mL/min — ABNORMAL LOW (ref >60)
Glucose, Bld: 98 mg/dL (ref 70–99)
Potassium: 3.7 mmol/L (ref 3.5–5.1)
Sodium: 141 mmol/L (ref 135–145)

## 2023-10-23 LAB — MAGNESIUM: Magnesium: 2.1 mg/dL (ref 1.7–2.4)

## 2023-10-23 MED ORDER — POTASSIUM CHLORIDE CRYS ER 10 MEQ PO TBCR: 10.0000 meq | EXTENDED_RELEASE_TABLET | Freq: Two times a day (BID) | ORAL | 0 refills | Status: DC

## 2023-10-23 MED ORDER — TORSEMIDE 20 MG PO TABS: ORAL_TABLET | ORAL | 0 refills | Status: DC

## 2023-10-23 MED ORDER — POTASSIUM CHLORIDE CRYS ER 10 MEQ PO TBCR: 10.0000 meq | EXTENDED_RELEASE_TABLET | Freq: Two times a day (BID) | ORAL | Status: DC

## 2023-10-23 MED ORDER — TORSEMIDE 20 MG PO TABS: ORAL_TABLET | ORAL | Status: DC

## 2023-10-23 MED ORDER — TORSEMIDE 20 MG PO TABS: 20.0000 mg | ORAL_TABLET | Freq: Two times a day (BID) | ORAL | 0 refills | Status: DC

## 2023-10-23 MED ORDER — POTASSIUM CHLORIDE CRYS ER 20 MEQ PO TBCR
40.0000 meq | EXTENDED_RELEASE_TABLET | Freq: Once | ORAL | Status: AC
  Administered 2023-10-23: 40 meq via ORAL
  Filled 2023-10-23: qty 2

## 2023-10-23 NOTE — Discharge Summary (Addendum)
 Physician Discharge Summary   Patient: Briana [facility] MRN: [id number] DOB: 25-Dec-1932  Admit date:     10/19/2023  Discharge date: 10/23/23  Discharge Physician: Murvin Mana   PCP: [name], MD   Recommendations at discharge:   Follow-up with PCP in 1 week. Follow-up with cardiology in 2 weeks.  Discharge Diagnoses: Principal Problem:   Unresponsive episode Active Problems:   Atrial fibrillation, chronic (HCC)   Hyperlipidemia   Essential hypertension   Acute on chronic diastolic CHF (congestive heart failure) (HCC)   Chronic kidney disease, stage 3a (HCC)   Late onset Alzheimer's dementia with behavioral disturbance (HCC)   Abnormal LFTs   Obesity (BMI 30-39.9)   Moderate pulmonary hypertension (HCC)   Syncope and collapse  Resolved Problems:   * No resolved [facility] problems. *  [facility] Course: Briana [facility] is a 89 y.o. female with medical history significant of HTN, HLD, PVD, dCHF, dementia, depression, CKD-3A, obesity, A-fib on Eliquis , sickle cell trait, rectocele, who presents with unresponsiveness.  In the ambulance, they could not measure his heart blood pressure, did find a pulse. Condition improved after 2 hours. Patient was seen by cardiology, has mild volume overload, was started on IV Lasix . Condition continued to improve, leg edema and feet improving.  Discussed with Dr. Gollan, patient is medically stable for discharge.  Will continue torsemide  20 mg twice a day and follow-up with cardiology as outpatient.  Cardiology will mail patient a Zio recorder.  Assessment and Plan: Unresponsiveness Likely syncope and collapse.:  Discussed with patient daughter, patient had a sudden onset of loss consciousness for about 2 hours, no seizure observed. In the ambulance, patient pressures were not measurable, but she did have a pulse. By time she arrived in the emergency room, her condition improved, mental status improved, blood pressure  was not low.   Etiology of this episode unclear.  Unlikely to have a seizure. With decreased blood pressure, patient could have a syncope episode.  Telemetry so far had no arrhythmia or bradycardia.  Echocardiogram showed normal ejection fraction with indeterminant diastolic function, moderate pulm hypertension. MRI of the brain did not show a stroke, showed diffuse atrophy. Seen by cardiology, planning for ZIO recorder for 2 weeks at discharge. No recurrence in the [facility].   Atrial fibrillation, chronic (HCC):  Heart rate under control, continue beta-blocker and anticoagulation.     Hyperlipidemia -Lipitor   Essential hypertension Continue beta-blocker and lisinopril .   Acute on Chronic diastolic CHF (congestive heart failure) (HCC) POA Moderate pulm hypertension.:  Patient seen by cardiology, deemed significant volume overload.  Patient had acute on chronic diastolic congestive heart failure at time of admission.  Lasix  is started.  Volume status has improved, currently medically stable.  Discussion with cardiology, will continue torsemide  20 mg twice a day and follow-up with cardiology.   Chronic kidney disease, stage 3a (HCC) Hypokalemia. Function still stable, potassium has normalized.  Patient was taking potassium chronically with diuretics.   Late onset Alzheimer's dementia with behavioral disturbance (HCC) -Donepezil  and memantine    Abnormal LFTs:  Patient ultrasound performed as outpatient, still pending results.   Obesity (BMI 30-39.9): Patient has Obesity Class II, with body weight 111.2 Kg and BMI 38.39 kg/m2.  - Encourage losing weight - Exercise and healthy diet        Consultants: Cardiology Procedures performed: None  Disposition: Home Diet recommendation:  Discharge Diet Orders (From admission, onward)     Start     Ordered   10/23/23 0000  Diet - low sodium heart healthy        10/23/23 0911           Cardiac diet DISCHARGE  MEDICATION: Allergies as of 10/23/2023       Reactions   Shellfish Allergy    Unable to walk        Medication List     STOP taking these medications    Cholecalciferol  50 MCG (2000 UT) Caps   furosemide  20 MG tablet Commonly known as: LASIX    sertraline  25 MG tablet Commonly known as: Zoloft        TAKE these medications    atorvastatin  10 MG tablet Commonly known as: LIPITOR Take 10 mg by mouth at bedtime.   Calcium  + D3 600-800 MG-UNIT Tabs Take 1 tablet by mouth daily.   donepezil  10 MG tablet Commonly known as: ARICEPT  TAKE 1 TABLET(10 MG) BY MOUTH AT BEDTIME   Eliquis  5 MG Tabs tablet Generic drug: apixaban  TAKE ONE TABLET BY MOUTH TWICE A DAY   gabapentin  100 MG capsule Commonly known as: NEURONTIN  Take 100 mg by mouth 2 (two) times daily.   ketotifen  0.025 % ophthalmic solution Commonly known as: ZADITOR  1 drop 2 (two) times daily. Thera Tears   lisinopril  20 MG tablet Commonly known as: ZESTRIL  TAKE ONE TABLET BY MOUTH EVERY MORNING AND TAKE ONE-HALF TABLET BY MOUTH EVERY EVENING AS DIRECTED   memantine  10 MG tablet Commonly known as: NAMENDA  Take 1 tablet (10 mg total) by mouth 2 (two) times daily.   Multi-Vitamins Tabs Take 1 tablet by mouth daily.   Omega-3 Fish Oil 1200 MG Caps Take 1,200 mg by mouth daily.   potassium chloride  10 MEQ tablet Commonly known as: KLOR-CON  M Take 1 tablet (10 mEq total) by mouth daily.   propranolol  10 MG tablet Commonly known as: INDERAL  TAKE 1 TABLET(10 MG) BY MOUTH THREE TIMES DAILY   REFRESH DRY EYE THERAPY OP Apply to eye.   torsemide  20 MG tablet Commonly known as: DEMADEX  Take 1 tablet (20 mg total) by mouth 2 (two) times daily.        Follow-up Information     [name], MD Follow up in 1 week(s).   Specialty: Internal Medicine Why: [facility] follow up Contact information: [address] [place] [address] [phone]         Perla Evalene PARAS, MD Follow up in 2 week(s).   Specialty: Cardiology Contact information: [address] [place] [address] [place] [address] [phone]                Discharge Exam: Briana Howe   10/19/23 1527 10/22/23 0500  Weight: 111.2 kg 111.4 kg   General exam: Appears calm and comfortable  Respiratory system: Clear to auscultation. Respiratory effort normal. Cardiovascular system: Appears regular. No JVD, murmurs, rubs, gallops or clicks.  Gastrointestinal system: Abdomen is nondistended, soft and nontender. No organomegaly or masses felt. Normal bowel sounds heard. Central nervous system: Alert and oriented x2.  No focal neurological deficits. Extremities: Leg edema largely resolved. Skin: No rashes, lesions or ulcers Psychiatry: Judgement and insight appear normal. Mood & affect appropriate.    Condition at discharge: good  The results of significant diagnostics from this hospitalization (including imaging, microbiology, ancillary and laboratory) are listed below for reference.   Imaging Studies: MR BRAIN WO CONTRAST Result Date: 10/20/2023 CLINICAL DATA:  Provided history: Neuro deficit, acute, stroke suspected. EXAM: MRI HEAD WITHOUT CONTRAST TECHNIQUE: Multiplanar, multiecho pulse sequences  of the brain and surrounding structures were obtained without intravenous contrast. COMPARISON:  Head CT 10/19/2023.  Brain MRI 03/01/2020. FINDINGS: Brain: Cerebral atrophy with disproportionately prominent anterior right temporal lobe volume loss. Mild cerebellar atrophy. Multifocal T2 FLAIR hyperintense signal abnormality within the cerebral white matter, nonspecific but compatible with mild chronic small vessel ischemic disease. Punctate chronic microhemorrhage within the posterior left frontal lobe. There is no acute infarct. No evidence of an intracranial mass. No chronic intracranial blood products. No extra-axial fluid collection. No midline shift. Vascular: Maintained flow voids  within the proximal large arterial vessels. Skull and upper cervical spine: No focal worrisome marrow lesion. Sinuses/Orbits: No mass or acute finding within the imaged orbits. Prior bilateral ocular lens replacement. No significant paranasal sinus disease. IMPRESSION: 1. No evidence of an acute intracranial abnormality. 2. Mild chronic small vessel ischemic changes within the cerebral white matter, slightly progressed since the MRI of 03/01/2020. 3. Cerebral atrophy with disproportionately prominent anterior right temporal lobe volume loss. 4. Mild cerebellar atrophy. Electronically Signed   By: Rockey Childs D.O.   On: 10/20/2023 15:13   ECHOCARDIOGRAM COMPLETE Result Date: 10/20/2023    ECHOCARDIOGRAM REPORT   Patient Name:   Briana Howe Date of Exam: 10/20/2023 Medical Rec #:  [id number]                  Height:       67.0 in Accession #:    [id number]                 Weight:       245.1 lb Date of Birth:  November 14, 1932                  BSA:          2.205 m Patient Age:    89 years                   BP:           116/78 mmHg Patient Gender: F                          HR:           65 bpm. Exam Location:  [facility] Procedure: 2D Echo, Cardiac Doppler, Color Doppler and Intracardiac            Opacification Agent (Both Spectral and Color Flow Doppler were            utilized during procedure). Indications:     Syncope R55  History:         Patient has prior history of Echocardiogram examinations, most                  recent 06/08/2021. Signs/Symptoms:Altered Mental Status.  Sonographer:     Ashley McNeely-Sloane Referring Phys:  [id number] XILIN NIU Diagnosing Phys: Evalene Lunger MD IMPRESSIONS  1. Left ventricular ejection fraction, by estimation, is 60 to 65%. The left ventricle has normal function. The left ventricle has no regional wall motion abnormalities. Left ventricular diastolic parameters are indeterminate.  2. Right ventricular systolic function is normal. The right ventricular size is normal. There  is moderately elevated pulmonary artery systolic pressure. The estimated right ventricular systolic pressure is 50.4 mmHg.  3. Left atrial size was mildly dilated.  4. The mitral valve is normal in structure. No evidence of mitral valve regurgitation. No evidence of mitral stenosis.  5. Tricuspid valve regurgitation  is mild to moderate.  6. The aortic valve is normal in structure. Aortic valve regurgitation is not visualized. No aortic stenosis is present.  7. The inferior vena cava is normal in size with <50% respiratory variability, suggesting right atrial pressure of 8 mmHg.  8. Rhythm is atrial fibrillation, rate 70 to 80 bpm FINDINGS  Left Ventricle: Left ventricular ejection fraction, by estimation, is 60 to 65%. The left ventricle has normal function. The left ventricle has no regional wall motion abnormalities. Definity  contrast agent was given IV to delineate the left ventricular  endocardial borders. Strain was performed and the global longitudinal strain is indeterminate. The left ventricular internal cavity size was normal in size. There is no left ventricular hypertrophy. Left ventricular diastolic parameters are indeterminate. Right Ventricle: The right ventricular size is normal. No increase in right ventricular wall thickness. Right ventricular systolic function is normal. There is moderately elevated pulmonary artery systolic pressure. The tricuspid regurgitant velocity is 3.37 m/s, and with an assumed right atrial pressure of 5 mmHg, the estimated right ventricular systolic pressure is 50.4 mmHg. Left Atrium: Left atrial size was mildly dilated. Right Atrium: Right atrial size was normal in size. Pericardium: There is no evidence of pericardial effusion. Mitral Valve: The mitral valve is normal in structure. No evidence of mitral valve regurgitation. No evidence of mitral valve stenosis. Tricuspid Valve: The tricuspid valve is normal in structure. Tricuspid valve regurgitation is mild to moderate.  No evidence of tricuspid stenosis. Aortic Valve: The aortic valve is normal in structure. Aortic valve regurgitation is not visualized. No aortic stenosis is present. Aortic valve mean gradient measures 3.0 mmHg. Aortic valve peak gradient measures 4.4 mmHg. Aortic valve area, by VTI measures 2.12 cm. Pulmonic Valve: The pulmonic valve was normal in structure. Pulmonic valve regurgitation is not visualized. No evidence of pulmonic stenosis. Aorta: The aortic root is normal in size and structure. Venous: The inferior vena cava is normal in size with less than 50% respiratory variability, suggesting right atrial pressure of 8 mmHg. IAS/Shunts: No atrial level shunt detected by color flow Doppler. Additional Comments: 3D was performed not requiring image post processing on an independent workstation and was indeterminate.  LEFT VENTRICLE PLAX 2D LVIDd:         4.20 cm     Diastology LVIDs:         2.95 cm     LV e' medial:    7.62 cm/s LV PW:         1.40 cm     LV E/e' medial:  14.7 LV IVS:        1.00 cm     LV e' lateral:   9.57 cm/s LVOT diam:     1.90 cm     LV E/e' lateral: 11.7 LV SV:         47 LV SV Index:   21 LVOT Area:     2.84 cm  LV Volumes (MOD) LV vol d, MOD A2C: 65.3 ml LV vol d, MOD A4C: 51.9 ml LV vol s, MOD A2C: 26.0 ml LV vol s, MOD A4C: 20.5 ml LV SV MOD A2C:     39.3 ml LV SV MOD A4C:     51.9 ml LV SV MOD BP:      37.3 ml RIGHT VENTRICLE            IVC RV S prime:     6.74 cm/s  IVC diam: 1.90 cm TAPSE (M-mode): 1.4 cm LEFT ATRIUM  Index        RIGHT ATRIUM           Index LA diam:        3.50 cm 1.59 cm/m   RA Area:     16.10 cm LA Vol (A2C):   38.6 ml 17.51 ml/m  RA Volume:   37.40 ml  16.96 ml/m LA Vol (A4C):   30.4 ml 13.79 ml/m LA Biplane Vol: 35.1 ml 15.92 ml/m  AORTIC VALVE                    PULMONIC VALVE AV Area (Vmax):    2.19 cm     PV Vmax:        0.73 m/s AV Area (Vmean):   1.93 cm     PV Vmean:       47.800 cm/s AV Area (VTI):     2.12 cm     PV VTI:          0.180 m AV Vmax:           105.00 cm/s  PV Peak grad:   2.1 mmHg AV Vmean:          74.600 cm/s  PV Mean grad:   1.0 mmHg AV VTI:            0.222 m      RVOT Peak grad: 2 mmHg AV Peak Grad:      4.4 mmHg AV Mean Grad:      3.0 mmHg LVOT Vmax:         81.20 cm/s LVOT Vmean:        50.800 cm/s LVOT VTI:          0.166 m LVOT/AV VTI ratio: 0.75  AORTA Ao Root diam: 3.00 cm Ao Asc diam:  3.90 cm MITRAL VALVE                TRICUSPID VALVE MV Area (PHT): 5.97 cm     TR Peak grad:   45.4 mmHg MV Decel Time: 127 msec     TR Mean grad:   30.0 mmHg MV E velocity: 112.00 cm/s  TR Vmax:        337.00 cm/s MV A velocity: 41.40 cm/s   TR Vmean:       261.0 cm/s MV E/A ratio:  2.71                             SHUNTS                             Systemic VTI:  0.17 m                             Systemic Diam: 1.90 cm                             Pulmonic VTI:  0.149 m Evalene Lunger MD Electronically signed by Evalene Lunger MD Signature Date/Time: 10/20/2023/12:12:04 PM    Final    CT Head Wo Contrast Result Date: 10/19/2023 CLINICAL DATA:  Mental status change, unknown cause. Unresponsiveness, now improved. EXAM: CT HEAD WITHOUT CONTRAST TECHNIQUE: Contiguous axial images were obtained from the base of the skull through the vertex without intravenous contrast. RADIATION DOSE REDUCTION: This exam was performed according  to the departmental dose-optimization program which includes automated exposure control, adjustment of the mA and/or kV according to patient size and/or use of iterative reconstruction technique. COMPARISON:  Head CT and MRI 03/01/2020 FINDINGS: Brain: There is no evidence of an acute infarct, intracranial hemorrhage, mass, midline shift, or extra-axial fluid collection. Cerebral white matter hypodensities are similar to the prior CT and are nonspecific but compatible with mild chronic small vessel ischemic disease. There is mild cerebral atrophy with asymmetrically prominent volume loss in the mesial right  temporal lobe. Vascular: Calcified atherosclerosis at the skull base. No hyperdense vessel. Skull: No fracture or suspicious lesion. Sinuses/Orbits: Visualized paranasal sinuses and mastoid air cells are clear. No acute finding in the included portions of the orbits. Other: None. IMPRESSION: 1. No evidence of acute intracranial abnormality. 2. Mild chronic small vessel ischemic disease. Electronically Signed   By: Dasie Hamburg M.D.   On: 10/19/2023 16:44    Microbiology: Results for orders placed or performed during the Howe encounter of 06/07/21  Resp Panel by RT-PCR (Flu A&B, Covid) Nasopharyngeal Swab     Status: None   Collection Time: 06/07/21 10:23 PM   Specimen: Nasopharyngeal Swab; Nasopharyngeal(NP) swabs in vial transport medium  Result Value Ref Range Status   SARS Coronavirus 2 by RT PCR NEGATIVE NEGATIVE Final    Comment: (NOTE) SARS-CoV-2 target nucleic acids are NOT DETECTED.  The SARS-CoV-2 RNA is generally detectable in upper respiratory specimens during the acute phase of infection. The lowest concentration of SARS-CoV-2 viral copies this assay can detect is 138 copies/mL. A negative result does not preclude SARS-Cov-2 infection and should not be used as the sole basis for treatment or other patient management decisions. A negative result may occur with  improper specimen collection/handling, submission of specimen other than nasopharyngeal swab, presence of viral mutation(s) within the areas targeted by this assay, and inadequate number of viral copies(<138 copies/mL). A negative result must be combined with clinical observations, patient history, and epidemiological information. The expected result is Negative.  Fact Sheet for Patients:  BloggerCourse.com  Fact Sheet for Healthcare Providers:  SeriousBroker.it  This test is no t yet approved or cleared by the [place]  FDA and  has been authorized for  detection and/or diagnosis of SARS-CoV-2 by FDA under an Emergency Use Authorization (EUA). This EUA will remain  in effect (meaning this test can be used) for the duration of the COVID-19 declaration under Section 564(b)(1) of the Act, 21 U.S.C.section 360bbb-3(b)(1), unless the authorization is terminated  or revoked sooner.       Influenza A by PCR NEGATIVE NEGATIVE Final   Influenza B by PCR NEGATIVE NEGATIVE Final    Comment: (NOTE) The Xpert Xpress SARS-CoV-2/FLU/RSV plus assay is intended as an aid in the diagnosis of influenza from Nasopharyngeal swab specimens and should not be used as a sole basis for treatment. Nasal washings and aspirates are unacceptable for Xpert Xpress SARS-CoV-2/FLU/RSV testing.  Fact Sheet for Patients: BloggerCourse.com  Fact Sheet for Healthcare Providers: SeriousBroker.it  This test is not yet approved or cleared by the [place]  FDA and has been authorized for detection and/or diagnosis of SARS-CoV-2 by FDA under an Emergency Use Authorization (EUA). This EUA will remain in effect (meaning this test can be used) for the duration of the COVID-19 declaration under Section 564(b)(1) of the Act, 21 U.S.C. section 360bbb-3(b)(1), unless the authorization is terminated or revoked.  Performed at [facility], [address], [place], [place] [address]  Labs: CBC: Recent Labs  Lab 10/19/23 1529  WBC 6.0  NEUTROABS 2.5  HGB 12.6  HCT 34.8*  MCV 88.1  PLT 190   Basic Metabolic Panel: Recent Labs  Lab 10/19/23 1529 10/21/23 0834 10/22/23 0349 10/22/23 1149 10/23/23 0347  NA 142 145 140 142 141  K 3.7 3.3* 3.5 4.0 3.7  CL 104 105 102 104 104  CO2 28 30 31 30 29   GLUCOSE 95 102* 102* 105* 98  BUN 21 16 16 16 17   CREATININE 0.87 0.87 0.87 0.98 0.92  CALCIUM  9.7 9.3 9.3 9.4 9.3  MG  --  1.9 2.0  --  2.1   Liver Function Tests: Recent Labs  Lab 10/19/23 1529   AST 45*  ALT 35  ALKPHOS 106  BILITOT 1.4*  PROT 7.2  ALBUMIN 3.4*   CBG: No results for input(s): GLUCAP in the last 168 hours.  Discharge time spent: greater than 30 minutes.  Signed: Murvin Mana, MD [facility] 10/23/2023

## 2023-10-23 NOTE — Progress Notes (Signed)
 Daughter refused for patient to have vitals taken.

## 2023-10-23 NOTE — Progress Notes (Signed)
 Progress Note  Patient Name: Briana Howe Date of Encounter: 10/23/2023 [facility] Cardiologist: Evalene Lunger, MD   Interval Summary    The patient denies chest pain or SOB. She is still volume up. I/Os not accurate. Kidney function is stable.  Vital Signs Vitals:   10/22/23 1147 10/22/23 1511 10/22/23 1950 10/23/23 0409  BP: 139/79 126/75 135/77 134/65  Pulse: 90 95 80 74  Resp: 18 17 16 18   Temp: 97.7 F (36.5 C) 97.8 F (36.6 C) 97.9 F (36.6 C) 97.9 F (36.6 C)  TempSrc:   Oral   SpO2: 100% 97% 98% 99%  Weight:      Height:        Intake/Output Summary (Last 24 hours) at 10/23/2023 0711 Last data filed at 10/22/2023 2100 Gross per 24 hour  Intake 720 ml  Output 200 ml  Net 520 ml      10/22/2023    5:00 AM 10/19/2023    3:27 PM 09/01/2023    3:29 PM  Last 3 Weights  Weight (lbs) 245 lb 9.5 oz 245 lb 1.6 oz 241 lb 6.4 oz  Weight (kg) 111.4 kg 111.177 kg 109.498 kg      Telemetry/ECG  Afib HR 70s - Personally Reviewed  Physical Exam  GEN: No acute distress.   Neck: No JVD Cardiac: RRR, no murmurs, rubs, or gallops.  Respiratory: Clear to auscultation bilaterally. GI: Soft, nontender, non-distended  MS: 1+ edema  CV studies:   Echo 10/20/23 1. Left ventricular ejection fraction, by estimation, is 60 to 65%. The  left ventricle has normal function. The left ventricle has no regional  wall motion abnormalities. Left ventricular diastolic parameters are  indeterminate.   2. Right ventricular systolic function is normal. The right ventricular  size is normal. There is moderately elevated pulmonary artery systolic  pressure. The estimated right ventricular systolic pressure is 50.4 mmHg.   3. Left atrial size was mildly dilated.   4. The mitral valve is normal in structure. No evidence of mitral valve  regurgitation. No evidence of mitral stenosis.   5. Tricuspid valve regurgitation is mild to moderate.   6. The aortic valve is  normal in structure. Aortic valve regurgitation is  not visualized. No aortic stenosis is present.   7. The inferior vena cava is normal in size with <50% respiratory  variability, suggesting right atrial pressure of 8 mmHg.   8. Rhythm is atrial fibrillation, rate 70 to 80 bpm   Assessment & Plan   Unresponsiveness - unclear etiology, patient was unarousable at home for 20 minutes, found by her daughter. Pulse appreciated but unable to measure BP - in the ER BP was present - EKG showed permanent Afib - tele unremarkable - head MRI without stroke - patient with baseline dementia - orthostatics negative - echo showed LVEF 60-65%, no WMA, normal RV function, mild to mod TR - she denies further pre-syncope or syncope - plan for heart monitor at d/c   HTN - continue propranolol  10mg  TID - lisinopril  20mg  daily - IV lasix   - BP this AM 140/78   Diastolic CHF - PTA lasix  20mg  daily - BNP 496 - IV lasix  40mg  BID - I/OS not accurate - echo showed LVEF 60-65% - volume status is improving, but not at baseline. Continue with IV diuresis. Can likely switch to torsemide  20mg  at discharge   Permanent Afib - continue PTA Eliquis . She reports compliance with Eliquis . - PTA propranolol  10mg  TID for rate control  For questions or updates, please contact  [facility] Please consult [email or web address] for contact info under       Signed, Tyren Dugar VEAR Fishman, PA-C

## 2023-10-23 NOTE — Progress Notes (Signed)
 Physical Therapy Treatment Patient Details Name: Briana Howe MRN: [id number] DOB: 03/03/1933 Today's Date: 10/23/2023   History of Present Illness Pt is an 89 yo F who presented to ER secondary to and admitted for medical work up surrounding unresponsive episode, unclear etiology.  PMH includes HTN and CHF.    PT Comments  Pt was pleasant and motivated to participate during the session and put forth good effort throughout.  Pt required verbal cues only for hand placement during sit to/from stand training and demonstrated good eccentric and concentric control.   Pt ambulated 2 x 150' with slow cadence but was steady throughout with no overt LOB and with no adverse symptoms and only min lean on the RW for support.  Pt given 60 sec seated therapeutic rest break between amb bouts with SpO2 and HR WNL on room air. Pt will benefit from continued PT services upon discharge to safely address deficits listed in patient problem list for decreased caregiver assistance and eventual return to PLOF.      If plan is discharge home, recommend the following: A little help with walking and/or transfers;A little help with bathing/dressing/bathroom;Assistance with cooking/housework;Supervision due to cognitive status;Assist for transportation   Can travel by private vehicle        Equipment Recommendations  None recommended by PT    Recommendations for Other Services       Precautions / Restrictions Precautions Precautions: Fall Restrictions Weight Bearing Restrictions Per Provider Order: No     Mobility  Bed Mobility               General bed mobility comments: Pt seated in recliner chair pre/post session    Transfers Overall transfer level: Needs assistance Equipment used: Rolling walker (2 wheels) Transfers: Sit to/from Stand Sit to Stand: Supervision           General transfer comment: Min verbal cues for hand placement with good eccentric and concentric control and  stability    Ambulation/Gait Ambulation/Gait assistance: Supervision Gait Distance (Feet): 150 Feet x 2 Assistive device: Rolling walker (2 wheels) Gait Pattern/deviations: Step-through pattern, Decreased step length - right, Decreased step length - left, Trunk flexed Gait velocity: decreased     General Gait Details: Slow cadence but steady throughout with no overt LOB and with no adverse symptoms; 60 sec seated therapeutic rest break between amb bouts with SpO2 and HR WNL on room air   Stairs             Wheelchair Mobility     Tilt Bed    Modified Rankin (Stroke Patients Only)       Balance Overall balance assessment: Needs assistance Sitting-balance support: No upper extremity supported, Feet supported Sitting balance-Leahy Scale: Good     Standing balance support: Bilateral upper extremity supported, During functional activity Standing balance-Leahy Scale: Good                              Communication Communication Communication: No apparent difficulties  Cognition Arousal: Alert Behavior During Therapy: WFL for tasks assessed/performed   PT - Cognitive impairments: History of cognitive impairments                         Following commands: Intact      Cueing Cueing Techniques: Verbal cues  Exercises Total Joint Exercises Long Arc Quad: Strengthening, Both, 10 reps, 15 reps Knee Flexion: Strengthening, Both, 10 reps,  15 reps    General Comments        Pertinent Vitals/Pain Pain Assessment Pain Assessment: No/denies pain    Home Living                          Prior Function            PT Goals (current goals can now be found in the care plan section) Progress towards PT goals: Progressing toward goals    Frequency    Min 2X/week      PT Plan      Co-evaluation              AM-PAC PT 6 Clicks Mobility   Outcome Measure  Help needed turning from your back to your side while in a  flat bed without using bedrails?: None Help needed moving from lying on your back to sitting on the side of a flat bed without using bedrails?: None Help needed moving to and from a bed to a chair (including a wheelchair)?: A Little Help needed standing up from a chair using your arms (e.g., wheelchair or bedside chair)?: A Little Help needed to walk in hospital room?: A Little Help needed climbing 3-5 steps with a railing? : A Little 6 Click Score: 20    End of Session Equipment Utilized During Treatment: Gait belt Activity Tolerance: Patient tolerated treatment well Patient left: in chair;with call bell/phone within reach;with chair alarm set Nurse Communication: Mobility status PT Visit Diagnosis: Muscle weakness (generalized) (M62.81);Difficulty in walking, not elsewhere classified (R26.2)     Time: 9153-9092 PT Time Calculation (min) (ACUTE ONLY): 21 min  Charges:    $Gait Training: 8-22 mins PT General Charges $$ ACUTE PT VISIT: 1 Visit                     D. Scott Kathreen Dileo PT, DPT 10/23/23, 9:30 AM

## 2023-10-24 ENCOUNTER — Telehealth: Payer: Self-pay

## 2023-10-24 NOTE — Telephone Encounter (Signed)
 Reviewed.  No hospital follow up appt scheduled. Please confirm is scheduled.

## 2023-10-24 NOTE — Telephone Encounter (Signed)
 Please call her daughter to schedule her for HFU appointment.

## 2023-10-24 NOTE — Transitions of Care (Post Inpatient/ED Visit) (Signed)
 10/24/2023  Name: Briana Howe MRN: [id number] DOB: September 08, 1932  Today's TOC FU Call Status: Today's TOC FU Call Status:: Successful TOC FU Call Completed TOC FU Call Complete Date: 10/24/23 Patient's Name and Date of Birth confirmed.  Transition Care Management Follow-up Telephone Call Date of Discharge: 10/23/23 Discharge Facility: [facility]) Type of Discharge: Inpatient Admission How have you been since you were released from the hospital?: Better Any questions or concerns?: No  Items Reviewed: Did you receive and understand the discharge instructions provided?: Yes Medications obtained,verified, and reconciled?: Yes (Medications Reviewed) Any new allergies since your discharge?: No Dietary orders reviewed?: Yes Do you have support at home?: Yes People in Home [RPT]: child(ren), adult  Medications Reviewed Today: Medications Reviewed Today     Reviewed by Briana Pan, LPN (Licensed Practical Nurse) on 10/24/23 at 1228  Med List Status: <None>   Medication Order Taking? Sig Documenting Provider Last Dose Status Informant  atorvastatin  (LIPITOR) 10 MG tablet [id number] Yes Take 10 mg by mouth at bedtime. Provider, Historical, Howe  Active Family Member, Child  Calcium  Carb-Cholecalciferol  (CALCIUM  + D3) 600-800 MG-UNIT TABS [id number] Yes Take 1 tablet by mouth daily. Provider, Historical, Howe  Active Family Member, Child  donepezil  (ARICEPT ) 10 MG tablet [id number] Yes TAKE 1 TABLET(10 MG) BY MOUTH AT BEDTIME Briana Onetha NOVAK, Howe  Active Child, Family Member  ELIQUIS  5 MG TABS tablet [id number] Yes TAKE ONE TABLET BY MOUTH TWICE A DAY Gollan, Briana Howe  Active Child, Family Member  gabapentin  (NEURONTIN ) 100 MG capsule [id number] Yes Take 100 mg by mouth 2 (two) times daily. Provider, Historical, Howe  Active Family Member, Child  Glycerin-Polysorbate 80 (REFRESH DRY EYE THERAPY OP) [id number] Yes Apply to eye. Provider, Historical, Howe   Active Child, Family Member           Med Note Briana Howe   Wed Oct 19, 2023  7:32 PM) Prn for dry eyes  ketotifen  (ZADITOR ) 0.025 % ophthalmic solution [id number] Yes 1 drop 2 (two) times daily. Briana Howe Provider, Historical, Howe  Active Child, Family Member           Med Note Briana Howe   Wed Oct 19, 2023  7:33 PM) prn  lisinopril  (ZESTRIL ) 20 MG tablet [id number] Yes TAKE ONE TABLET BY MOUTH EVERY MORNING AND TAKE ONE-HALF TABLET BY MOUTH EVERY EVENING AS DIRECTED Gollan, Briana Howe  Active Child, Family Member  memantine  (NAMENDA ) 10 MG tablet [id number] Yes Take 1 tablet (10 mg total) by mouth 2 (two) times daily. Briana Onetha NOVAK, Howe  Active Child, Family Member  Multiple Vitamin (MULTI-VITAMINS) TABS [id number] Yes Take 1 tablet by mouth daily.  Provider, Historical, Howe  Active Family Member, Child           Med Note Briana Howe   Dju Mar 01, 2020  7:03 PM)    Omega-3 Fatty Acids (OMEGA-3 FISH OIL) 1200 MG CAPS [id number] Yes Take 1,200 mg by mouth daily.  Provider, Historical, Howe  Active Family Member, Child  potassium chloride  (KLOR-CON  M) 10 MEQ tablet [id number] Yes Take 1 tablet (10 mEq total) by mouth 2 (two) times daily. Briana Pillion, Howe  Active   propranolol  (INDERAL ) 10 MG tablet [id number] Yes TAKE 1 TABLET(10 MG) BY MOUTH THREE TIMES DAILY Gollan, Briana Howe  Active Child, Family Member  torsemide  (DEMADEX ) 20 MG tablet [id number] Yes Take 1 tablet (20 mg total) by mouth 2 (two) times daily for 7 days, THEN  1 tablet (20 mg total) daily. Briana Pillion, Howe  Active   Med List Note Briana Howe, CPhT 06/07/21 2143): Pt takes medication 0900 and 2100            Home Care and Equipment/Supplies: Were Home Health Services Ordered?: Yes Name of Home Health Agency:: [facility] Has Agency set up a time to come to your home?: No EMR reviewed for Home Health Orders: Orders present/patient has not received call (refer to CM for follow-up) Any new equipment or  medical supplies ordered?: NA  Functional Questionnaire: Do you need assistance with bathing/showering or dressing?: No Do you need assistance with meal preparation?: Yes Do you need assistance with eating?: No Do you have difficulty maintaining continence: No Do you need assistance with getting out of bed/getting out of a chair/moving?: No Do you have difficulty managing or taking your medications?: Yes  Follow up appointments reviewed: PCP Follow-up appointment confirmed?: No (declined, will have daughter call to schedule) Howe Provider Line Number:[phone] Given: No Specialist Hospital Follow-up appointment confirmed?: No Reason Specialist Follow-Up Not Confirmed: Patient has Specialist Provider Number and will Call for Appointment Do you need transportation to your follow-up appointment?: No Do you understand care options if your condition(s) worsen?: Yes-patient verbalized understanding    SIGNATURE Briana Lemmings, LPN [facility] Nurse Health Advisor Direct Dial [phone]

## 2023-10-24 NOTE — Telephone Encounter (Signed)
 FYI we are holding message for HFU appt. Needs to be seen by 8/10

## 2023-10-24 NOTE — Telephone Encounter (Signed)
 Noted

## 2023-10-26 ENCOUNTER — Telehealth: Payer: Self-pay

## 2023-10-26 ENCOUNTER — Ambulatory Visit: Payer: Self-pay | Admitting: Internal Medicine

## 2023-10-26 ENCOUNTER — Other Ambulatory Visit: Payer: Self-pay | Admitting: Internal Medicine

## 2023-10-26 DIAGNOSIS — R55 Syncope and collapse: Secondary | ICD-10-CM

## 2023-10-26 NOTE — Telephone Encounter (Signed)
 LM for daughter. Need to schedule HFU appt 11/02/23 at 12:00

## 2023-10-26 NOTE — Telephone Encounter (Signed)
 Briana Howe, see other note - you messaged me about work in appt

## 2023-10-26 NOTE — Telephone Encounter (Signed)
 Copied from CRM [id number]. Topic: Appointments - Scheduling Inquiry for Clinic >> Oct 26, 2023 10:59 AM [name] wrote: Reason for CRM: passed out at the house-not sure what was going on, barely had a pulse, and barely breathing-had lasix  in the hospital at [facility] and discharged 7/27  Daughter Briana Howe is trying to schedule a hospital follow up but patient only wants to see Dr. Glendia who is booking out until October. Patient has dementia and refuses to see anyone else. Can we get her worked in?   Daughter Briana Howe-[phone]

## 2023-10-26 NOTE — Telephone Encounter (Signed)
 See other note. Sent message to Dr Glendia for work in spot.

## 2023-10-26 NOTE — Telephone Encounter (Signed)
 Copied from CRM [id number]. Topic: Referral - Request for Referral >> Oct 26, 2023 10:54 AM [name] wrote: Did the patient discuss referral with their provider in the last year? Yes (If No - schedule appointment) (If Yes - send message)  Appointment offered? No  Type of order/referral and detailed reason for visit: Patient would like a second opinion for a heart Dr.   Preference of office, provider, location: Ezra Shuck [facility] [address], [address] [place], [place] [address] [phone] Locate on Map  If referral order, have you been seen by this specialty before? Yes (If Yes, this issue or another issue? When? Where? Yes, Dr. Gollan  Can we respond through MyChart? Yes

## 2023-10-26 NOTE — Telephone Encounter (Signed)
 OK to refer to Dr Rolan or wait until HFU?

## 2023-10-26 NOTE — Progress Notes (Signed)
 Order placed for cardiology referral.

## 2023-10-26 NOTE — Telephone Encounter (Signed)
 Ok

## 2023-10-26 NOTE — Telephone Encounter (Signed)
 We could do 8/6 at 12? Let me know

## 2023-10-27 NOTE — Telephone Encounter (Signed)
 Noted. See other note.  I have already placed referral to cardiology - Dr Ezra Shuck

## 2023-10-27 NOTE — Telephone Encounter (Signed)
Patient has been scheduled for hospital follow up.

## 2023-10-27 NOTE — Telephone Encounter (Signed)
 Called pt daughter Lonell but she does not have a vm set up to leave a message to call the office back.

## 2023-10-27 NOTE — Telephone Encounter (Signed)
 I had left a message for them about scheduling on 8/6 but they also are asking for new referral with cardiology to Dr Ezra Shuck

## 2023-11-02 ENCOUNTER — Ambulatory Visit: Admitting: Internal Medicine

## 2023-11-02 ENCOUNTER — Encounter: Payer: Self-pay | Admitting: Internal Medicine

## 2023-11-02 ENCOUNTER — Ambulatory Visit: Payer: Self-pay | Admitting: Internal Medicine

## 2023-11-02 VITALS — BP 138/78 | HR 71 | Temp 98.1°F | Ht 67.0 in | Wt 228.0 lb

## 2023-11-02 DIAGNOSIS — R404 Transient alteration of awareness: Secondary | ICD-10-CM

## 2023-11-02 DIAGNOSIS — R739 Hyperglycemia, unspecified: Secondary | ICD-10-CM

## 2023-11-02 DIAGNOSIS — G301 Alzheimer's disease with late onset: Secondary | ICD-10-CM | POA: Diagnosis not present

## 2023-11-02 DIAGNOSIS — E782 Mixed hyperlipidemia: Secondary | ICD-10-CM | POA: Diagnosis not present

## 2023-11-02 DIAGNOSIS — I272 Pulmonary hypertension, unspecified: Secondary | ICD-10-CM

## 2023-11-02 DIAGNOSIS — I10 Essential (primary) hypertension: Secondary | ICD-10-CM

## 2023-11-02 DIAGNOSIS — F02818 Dementia in other diseases classified elsewhere, unspecified severity, with other behavioral disturbance: Secondary | ICD-10-CM

## 2023-11-02 DIAGNOSIS — I480 Paroxysmal atrial fibrillation: Secondary | ICD-10-CM

## 2023-11-02 DIAGNOSIS — R945 Abnormal results of liver function studies: Secondary | ICD-10-CM

## 2023-11-02 DIAGNOSIS — R7989 Other specified abnormal findings of blood chemistry: Secondary | ICD-10-CM

## 2023-11-02 LAB — BASIC METABOLIC PANEL WITH GFR
BUN: 19 mg/dL (ref 6–23)
CO2: 36 meq/L — ABNORMAL HIGH (ref 19–32)
Calcium: 9.5 mg/dL (ref 8.4–10.5)
Chloride: 105 meq/L (ref 96–112)
Creatinine, Ser: 0.94 mg/dL (ref 0.40–1.20)
GFR: 53.79 mL/min — ABNORMAL LOW (ref >60.00)
Glucose, Bld: 83 mg/dL (ref 70–99)
Potassium: 3.7 meq/L (ref 3.5–5.1)
Sodium: 145 meq/L (ref 135–145)
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
## 2023-11-06 ENCOUNTER — Encounter: Payer: Self-pay | Admitting: Internal Medicine

## 2023-11-06 NOTE — Assessment & Plan Note (Signed)
 Slightly elevated AST on recent check. Previous alkaline phos level slightly elevated. Normalized on recent check. Follow. Abdominal ultrasound - unremarkable.

## 2023-11-06 NOTE — Assessment & Plan Note (Signed)
 Low-carb diet and exercise.  Follow met b and A1c.

## 2023-11-06 NOTE — Assessment & Plan Note (Signed)
Continue lipitor.  Low cholesterol diet and exercise.  Follow lipid panel.

## 2023-11-06 NOTE — Assessment & Plan Note (Signed)
 Continues on eliquis . Rate controlled. Propranolol  as directed. Stable. Has ben followed by Dr Gollan. Due to f/u with Dr Ezra Shuck 11/08/23.

## 2023-11-06 NOTE — Assessment & Plan Note (Signed)
 Continue aricept  and namenda .  Has seen neurology.

## 2023-11-06 NOTE — Assessment & Plan Note (Signed)
 Recent evaluation in hospital.  Telemetry - revealed no arrhythmia or bradycardia. ECHO - normal EF with indeterminant diastolic function, moderate pulmonary hypertension. MRI - brain - no stroke, diffuse atrophy. Evaluated by cardiology. Mild volume overload. Started on IV lasix . Recommended continuing torsemide  and f/u with cardiology.  Recommended zio monitor. Has f/u with cardiology next week. Weight back to baseline. Breathing stable.

## 2023-11-06 NOTE — Assessment & Plan Note (Signed)
 Admitted 10/19/23 - 10/23/23 - after presenting with unresponsiveness. Had sudden LOC for about 2 hours. No seizure observed. Telemetry - revealed no arrhythmia or bradycardia. ECHO - normal EF with indeterminant diastolic function, moderate pulmonary hypertension. MRI - brain - no stroke, diffuse atrophy. Evaluated by cardiology. Mild volume overload. Started on IV lasix . Recommended continuing torsemide  and f/u with cardiology.  Recommended zio monitor. Since her discharge, she has had no recurring episodes. Has felt - stable. No chest pain. Breathing stable. No vomiting or diarrhea. Has f/u appt with Dr Ezra Shuck - 11/08/23. She is currently taking lasix  in the am and 1/2 torsemide  in pm. Has adjusted dose. Check metabolic panel today.

## 2023-11-06 NOTE — Assessment & Plan Note (Signed)
 Blood pressure on my check 118/78. Will hold on making any changes in her medication. Weight back down to her baseline. Recheck metabolic panel today.

## 2023-11-07 ENCOUNTER — Telehealth: Payer: Self-pay | Admitting: Cardiology

## 2023-11-07 NOTE — Telephone Encounter (Signed)
 Called to confirm/remind patient of their appointment at [facility] on 11/08/23.   Appointment:   [] Confirmed  [x] Left mess   [] No answer/No voice mail  [] VM Full/unable to leave message  [] Phone not in service  Patient reminded to bring all medications and/or complete list.  Confirmed patient has transportation. Gave directions, instructed to utilize valet parking.

## 2023-11-08 ENCOUNTER — Other Ambulatory Visit (HOSPITAL_COMMUNITY): Payer: Self-pay | Admitting: Cardiology

## 2023-11-08 ENCOUNTER — Ambulatory Visit: Attending: Cardiology | Admitting: Cardiology

## 2023-11-08 ENCOUNTER — Encounter: Payer: Self-pay | Admitting: Cardiology

## 2023-11-08 ENCOUNTER — Inpatient Hospital Stay (HOSPITAL_COMMUNITY)
Admission: RE | Admit: 2023-11-08 | Discharge: 2023-11-08 | Disposition: A | Discharge status: Home / Self Care | Source: Ambulatory Visit | Attending: Cardiology | Admitting: Cardiology

## 2023-11-08 VITALS — BP 137/74 | HR 80 | Wt 231.2 lb

## 2023-11-08 DIAGNOSIS — I130 Hypertensive heart and chronic kidney disease with heart failure and stage 1 through stage 4 chronic kidney disease, or unspecified chronic kidney disease: Secondary | ICD-10-CM | POA: Diagnosis not present

## 2023-11-08 DIAGNOSIS — I4821 Permanent atrial fibrillation: Secondary | ICD-10-CM | POA: Insufficient documentation

## 2023-11-08 DIAGNOSIS — R404 Transient alteration of awareness: Secondary | ICD-10-CM | POA: Insufficient documentation

## 2023-11-08 DIAGNOSIS — N183 Chronic kidney disease, stage 3 unspecified: Secondary | ICD-10-CM | POA: Insufficient documentation

## 2023-11-08 DIAGNOSIS — R55 Syncope and collapse: Secondary | ICD-10-CM

## 2023-11-08 DIAGNOSIS — F0390 Unspecified dementia without behavioral disturbance: Secondary | ICD-10-CM | POA: Insufficient documentation

## 2023-11-08 DIAGNOSIS — I5032 Chronic diastolic (congestive) heart failure: Secondary | ICD-10-CM | POA: Diagnosis not present

## 2023-11-08 DIAGNOSIS — Z79899 Other long term (current) drug therapy: Secondary | ICD-10-CM | POA: Diagnosis not present

## 2023-11-08 LAB — BASIC METABOLIC PANEL WITH GFR
Anion gap: 8 (ref 5–15)
BUN: 18 mg/dL (ref 8–23)
CO2: 28 mmol/L (ref 22–32)
Calcium: 9.2 mg/dL (ref 8.9–10.3)
Chloride: 108 mmol/L (ref 98–111)
Creatinine, Ser: 1.02 mg/dL — ABNORMAL HIGH (ref 0.44–1.00)
GFR, Estimated: 53 mL/min — ABNORMAL LOW (ref >60)
Glucose, Bld: 87 mg/dL (ref 70–99)
Potassium: 3.8 mmol/L (ref 3.5–5.1)
Sodium: 144 mmol/L (ref 135–145)

## 2023-11-08 LAB — BRAIN NATRIURETIC PEPTIDE: B Natriuretic Peptide: 448 pg/mL — ABNORMAL HIGH (ref 0.0–100.0)

## 2023-11-08 MED ORDER — METOPROLOL SUCCINATE ER 25 MG PO TB24
12.5000 mg | ORAL_TABLET | Freq: Every day | ORAL | 3 refills | Status: AC
Start: 2023-11-08 — End: 2024-11-02

## 2023-11-08 MED ORDER — FARXIGA 10 MG PO TABS: 10.0000 mg | ORAL_TABLET | Freq: Every day | ORAL | 11 refills | Status: AC

## 2023-11-08 MED ORDER — TORSEMIDE 20 MG PO TABS: 20.0000 mg | ORAL_TABLET | Freq: Every day | ORAL | 11 refills | Status: DC

## 2023-11-08 NOTE — Patient Instructions (Addendum)
 Medication Changes:  Discontinue Lasix  and Propranolol .  Start Taking Torsemide  20 MG once daily in the morning.  Start Farxiga  10 MG daily.  Start Metoprolol  25 MG once daily.   Lab Work:  Go over to the [facility]. Go pass the gift shop and have your blood work completed TODAY and AGAIN in 1 WEEK due to medication chnages.  We will only call you if the results are abnormal or if the provider would like to make medication changes.  No news is good news.   Testing/Procedures:  Your provider has recommended that  you wear a Zio Patch for 14 days.  This monitor will record your heart rhythm for our review.  IF you have any symptoms while wearing the monitor please press the button.  If you have any issues with the patch or you notice a red or orange light on it please call the company at [phone].  Once you remove the patch please mail it back to the company as soon as possible so we can get the results.    Special Instructions // Education:  Your provider has ordered a PYP Scan at [facility] in [place]. Someone will call you to schedule this soon.   Follow-Up in: 2 weeks with Ellouise Class, FNP.   Thank you for choosing [facility].    At the [facility], you and your health needs are our priority. We have a designated team specialized in the treatment of Heart Failure. This Care Team includes your primary Heart Failure Specialized Cardiologist (physician), Advanced Practice Providers (APPs- Physician Assistants and Nurse Practitioners), and Pharmacist who all work together to provide you with the care you need, when you need it.   You may see any of the following providers on your designated Care Team at your next follow up:  Dr. Toribio Fuel Dr. Ezra Shuck Dr. Ria Commander Dr. Morene Brownie Ellouise Class, FNP Jaun Bash, RPH-CPP  Please be sure to bring in all your medications  bottles to every appointment.   Need to Contact Us :  If you have any questions or concerns before your next appointment please send us  a message through [place] or call our office at [phone].    TO LEAVE A MESSAGE FOR THE NURSE SELECT OPTION 2, PLEASE LEAVE A MESSAGE INCLUDING: YOUR NAME DATE OF BIRTH CALL BACK NUMBER REASON FOR CALL**this is important as we prioritize the call backs  YOU WILL RECEIVE A CALL BACK THE SAME DAY AS LONG AS YOU CALL BEFORE 4:00 PM

## 2023-11-09 ENCOUNTER — Ambulatory Visit (HOSPITAL_COMMUNITY): Payer: Self-pay | Admitting: Cardiology

## 2023-11-09 NOTE — Progress Notes (Signed)
 PCP: [name], MD HF Cardiology: Dr. Rolan  89 y.o. with history of HTN, dementia, permanent atrial fibrillation, CKD 3, and HFpEF was self-referred for evaluation of CHF.  I take care of her daughter as well.  She has been in atrial fibrillation since 2021.  She has moderate dementia and lives with her daughters.  In 7/25, she had an unresponsive episode at home that per family lasted 30 minutes.  She was admitted, telemetry showed no pauses or bradycardia.  MRI of her head showed no CVA.  She was not orthostatic.  Echo in 7/25 showed EF 60-65%, RV normal size/systolic function, PASP 50 mmHg.   She has been taking Lasix  20 mg in the morning and torsemide  10 mg in the afternoon.  I am not sure how she got on this particular regimen.  No dyspnea walking around the house.  She uses a cane or rollator.  She is short of breath walking longer distances outside the house.  Generally, no problems with ADLs.  No chest pain.  No lightheadedness.    ECG (personally reviewed, 7/25): Atrial fibrillation with PVCs  Labs (2/25): normal SPEP/UPEP Labs (6/25): LDL 73 Labs (7/25): K 3.7, creatinine 0.94, BNP 496  PMH: 1. HTN 2. Atrial fibrillation: Permanent since 2021.  3. Hyperlipidemia 4. THR 5. Moderate dementia 6. CKD stage 3 7. HFpEF: Echo (7/25) with EF 60-65%, RV normal size/systolic function, PASP 50 mmHg.  8. Sickle cell trait  ROS: All systems reviewed and negative except as per HPI.  Social History   Socioeconomic History   Marital status: Widowed    Spouse name: Not on file   Number of children: Not on file   Years of education: Not on file   Highest education level: 12th grade  Occupational History   Not on file  Tobacco Use   Smoking status: Never   Smokeless tobacco: Never  Vaping Use   Vaping status: Never Used  Substance and Sexual Activity   Alcohol use: Never   Drug use: Never   Sexual activity: Not Currently    Birth control/protection: Surgical  Other Topics  Concern   Not on file  Social History Narrative   Lives at home with daughters   Caffeine: 2-3 cups/day of coffee, soda every now and then. Hot tea occasionally    Right handed   Social Drivers of Health   Financial Resource Strain: Low Risk  (08/24/2023)   Overall Financial Resource Strain (CARDIA)    Difficulty of Paying Living Expenses: Not hard at all  Food Insecurity: No Food Insecurity (10/20/2023)   Hunger Vital Sign    Worried About Running Out of Food in the Last Year: Never true    Ran Out of Food in the Last Year: Never true  Transportation Needs: No Transportation Needs (10/20/2023)   PRAPARE - Administrator, Civil Service (Medical): No    Lack of Transportation (Non-Medical): No  Physical Activity: Not on file  Stress: No Stress Concern Present (08/24/2023)   [employer] of Occupational Health - Occupational Stress Questionnaire    Feeling of Stress : Not at all  Social Connections: Moderately Integrated (10/20/2023)   Social Connection and Isolation Panel    Frequency of Communication with Friends and Family: More than three times a week    Frequency of Social Gatherings with Friends and Family: More than three times a week    Attends Religious Services: More than 4 times per year    Active Member  of Clubs or Organizations: Yes    Attends Banker Meetings: More than 4 times per year    Marital Status: Widowed  Intimate Partner Violence: Patient Unable To Answer (10/20/2023)   Humiliation, Afraid, Rape, and Kick questionnaire    Fear of Current or Ex-Partner: Patient unable to answer    Emotionally Abused: Patient unable to answer    Physically Abused: Patient unable to answer    Sexually Abused: Patient unable to answer   Family History  Problem Relation Age of Onset   Heart attack Father 55   Heart failure Father    Crohn's disease Father    Other Father        enlarged heart, poor circulation   Heart failure Sister     Hypertension Sister    Diabetes Sister    Heart failure Sister    Hypertension Sister    Heart failure Sister    Hypertension Sister    Diabetes Mother    Hypertension Mother    Lung cancer Mother    Liver cancer Mother    Glaucoma Mother    Arthritis Mother    Cancer Mother    Breast cancer Maternal Aunt 2   Multiple sclerosis Daughter    High blood pressure Daughter    High blood pressure Daughter    Thyroid  nodules Daughter    Sickle cell anemia Daughter    Supraventricular tachycardia Daughter    Other Daughter        defective protein c gene   Hearing loss Daughter    Vision loss Daughter    Colon cancer Neg Hx    Ovarian cancer Neg Hx    BP 137/74 (BP Location: Right Arm, Patient Position: Sitting, Cuff Size: Large)   Pulse 80   Wt 231 lb 4 oz (104.9 kg)   SpO2 98%   BMI 36.22 kg/m  General: NAD Neck: JVP 10 cm, no thyromegaly or thyroid  nodule.  Lungs: Clear to auscultation bilaterally with normal respiratory effort. CV: Nondisplaced PMI.  Heart irregular S1/S2, no S3/S4, no murmur.  1+ edema to knees.  No carotid bruit.  Normal pedal pulses.  Abdomen: Soft, nontender, no hepatosplenomegaly, no distention.  Skin: Intact without lesions or rashes.  Neurologic: Alert and oriented x 3.  Psych: Normal affect. Extremities: No clubbing or cyanosis.  HEENT: Normal.   Assessment/Plan: 1. HFpEF: Echo in 7/25 showed EF 60-65%, RV normal size/systolic function, PASP 50 mmHg.  NYHA class II-III, she is volume overloaded on exam.   - Stop Lasix , increase torsemide  to 20 mg daily.  BMET/BNP today, BMET in 1 week.  - Start Farxiga  10 mg daily.  - Cardiac amyloidosis is a concern, she has had negative myeloma screening in the past but will repeat myeloma panel.  I will arrange for PYP scan to assess for TTR cardiac amyloidosis.  2. HTN: BP is controlled.  - Continue lisinopril .  - Stop propranolol  (has been taking tid for BP) and use Toprol  XL 25 mg daily instead.  3.  Unresponsive episode: In 7/25.  Per family, she was breathing and had a pulse but would not respond.  Telemetry in hospital showed no bradycardia or pauses.  - I will arrange for Zio monitor x 2 wks to assess for significant arrhythmias.   Followup in 2 wks with APP to reassess volume.   I spent 56 minutes reviewing records, interviewing/examining patient, and managing orders.   Ezra Shuck 11/09/2023

## 2023-11-11 LAB — MULTIPLE MYELOMA PANEL, SERUM
Albumin SerPl Elph-Mcnc: 3.3 g/dL (ref 2.9–4.4)
Albumin/Glob SerPl: 1.1 (ref 0.7–1.7)
Alpha 1: 0.2 g/dL (ref 0.0–0.4)
Alpha2 Glob SerPl Elph-Mcnc: 0.7 g/dL (ref 0.4–1.0)
B-Globulin SerPl Elph-Mcnc: 0.9 g/dL (ref 0.7–1.3)
Gamma Glob SerPl Elph-Mcnc: 1.4 g/dL (ref 0.4–1.8)
Globulin, Total: 3.2 g/dL (ref 2.2–3.9)
IgA: 268 mg/dL (ref 64–422)
IgG (Immunoglobin G), Serum: 1563 mg/dL (ref 586–1602)
IgM (Immunoglobulin M), Srm: 127 mg/dL (ref 26–217)
Total Protein ELP: 6.5 g/dL (ref 6.0–8.5)

## 2023-11-17 ENCOUNTER — Ambulatory Visit: Admitting: Student

## 2023-11-21 ENCOUNTER — Telehealth: Payer: Self-pay | Admitting: Family

## 2023-11-21 NOTE — Telephone Encounter (Signed)
 Called to confirm/remind patient of their appointment at the [facility] on 11/22/23.   Appointment:   [x] Confirmed  [] Left mess   [] No answer/No voice mail  [] VM Full/unable to leave message  [] Phone not in service  Patient reminded to bring all medications and/or complete list.  Confirmed patient has transportation. Gave directions, instructed to utilize valet parking.
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
## 2023-11-22 ENCOUNTER — Encounter: Payer: Self-pay | Admitting: Family

## 2023-11-22 ENCOUNTER — Other Ambulatory Visit: Payer: Self-pay

## 2023-11-22 ENCOUNTER — Encounter: Admitting: Family

## 2023-11-22 ENCOUNTER — Telehealth (HOSPITAL_COMMUNITY): Payer: Self-pay

## 2023-11-22 ENCOUNTER — Ambulatory Visit: Payer: Self-pay | Admitting: Family

## 2023-11-22 ENCOUNTER — Ambulatory Visit (HOSPITAL_BASED_OUTPATIENT_CLINIC_OR_DEPARTMENT_OTHER): Admitting: Family

## 2023-11-22 ENCOUNTER — Other Ambulatory Visit
Admission: RE | Admit: 2023-11-22 | Discharge: 2023-11-22 | Disposition: A | Discharge status: Home / Self Care | Source: Ambulatory Visit | Attending: Family | Admitting: Family

## 2023-11-22 VITALS — BP 110/62 | HR 90

## 2023-11-22 DIAGNOSIS — I10 Essential (primary) hypertension: Secondary | ICD-10-CM

## 2023-11-22 DIAGNOSIS — I5032 Chronic diastolic (congestive) heart failure: Secondary | ICD-10-CM | POA: Diagnosis not present

## 2023-11-22 DIAGNOSIS — I4821 Permanent atrial fibrillation: Secondary | ICD-10-CM | POA: Diagnosis not present

## 2023-11-22 LAB — BASIC METABOLIC PANEL WITH GFR
Anion gap: 8 (ref 5–15)
BUN: 23 mg/dL (ref 8–23)
CO2: 32 mmol/L (ref 22–32)
Calcium: 9.7 mg/dL (ref 8.9–10.3)
Chloride: 104 mmol/L (ref 98–111)
Creatinine, Ser: 1.29 mg/dL — ABNORMAL HIGH (ref 0.44–1.00)
GFR, Estimated: 40 mL/min — ABNORMAL LOW (ref >60)
Glucose, Bld: 93 mg/dL (ref 70–99)
Potassium: 3.4 mmol/L — ABNORMAL LOW (ref 3.5–5.1)
Sodium: 144 mmol/L (ref 135–145)

## 2023-11-22 MED ORDER — POTASSIUM CHLORIDE CRYS ER 10 MEQ PO TBCR: 20.0000 meq | EXTENDED_RELEASE_TABLET | Freq: Every day | ORAL | Status: DC

## 2023-11-22 MED ORDER — POTASSIUM CHLORIDE CRYS ER 10 MEQ PO TBCR: 10.0000 meq | EXTENDED_RELEASE_TABLET | Freq: Every day | ORAL | Status: DC

## 2023-11-22 NOTE — Telephone Encounter (Signed)
 Spoke to daughter Lonell about lab work. Daughter verbalized understanding of lab work, new medication changes and is agreeable to repeat blood work around the 12/06/23.

## 2023-11-22 NOTE — Telephone Encounter (Signed)
-----   Message from Olam ORN sent at 11/14/2023 10:44 AM EDT ----- Needs PRIOR Auth for ordered Amyloid please. :)

## 2023-11-22 NOTE — Addendum Note (Signed)
 Addended by: SHARL GRATE A on: 11/22/2023 02:58 PM   Modules accepted: Orders

## 2023-11-22 NOTE — Progress Notes (Signed)
 Error

## 2023-11-22 NOTE — Patient Instructions (Signed)
  Lab Work:  Labs done today, your results will be available in MyChart, we will contact you for abnormal readings.   Testing/Procedures:  Someone will call you to schedule your PYP scan. Waiting on insurance authorization currently.   Special Instructions // Education:  Do the following things EVERYDAY: Weigh yourself in the morning before breakfast. Write it down and keep it in a log. Take your medicines as prescribed Eat low salt foods--Limit salt (sodium) to 2000 mg per day.  Stay as active as you can everyday Limit all fluids for the day to less than 2 liters   Follow-Up in: 2 months with Ellouise Class, NP.    If you have any questions or concerns before your next appointment please send us  a message through [facility] or call our office at [phone], If it is after office hours your call will be answered by our answering service and directed appropriately.     At the [facility], you and your health needs are our priority. We have a designated team specialized in the treatment of Heart Failure. This Care Team includes your primary Heart Failure Specialized Cardiologist (physician), Advanced Practice Providers (APPs- Physician Assistants and Nurse Practitioners), and Pharmacist who all work together to provide you with the care you need, when you need it.   You may see any of the following providers on your designated Care Team at your next follow up:  Dr. Toribio Fuel Dr. Ezra Shuck Dr. Ria Commander Dr. Odis Brownie Greig Mosses, NP Caffie Shed, [place] [address] [place], [place] Beckey Coe, NP [place] Lee, NP Ellouise Class, NP Jaun Bash, PharmD

## 2023-11-24 ENCOUNTER — Other Ambulatory Visit: Payer: Self-pay

## 2023-11-24 ENCOUNTER — Ambulatory Visit: Attending: Cardiology | Admitting: Physical Therapy

## 2023-11-24 ENCOUNTER — Encounter: Payer: Self-pay | Admitting: Physical Therapy

## 2023-11-24 DIAGNOSIS — R2689 Other abnormalities of gait and mobility: Secondary | ICD-10-CM | POA: Diagnosis not present

## 2023-11-24 DIAGNOSIS — M6281 Muscle weakness (generalized): Secondary | ICD-10-CM | POA: Diagnosis not present

## 2023-11-24 DIAGNOSIS — R262 Difficulty in walking, not elsewhere classified: Secondary | ICD-10-CM | POA: Insufficient documentation

## 2023-11-24 DIAGNOSIS — I5032 Chronic diastolic (congestive) heart failure: Secondary | ICD-10-CM | POA: Diagnosis not present
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
## 2023-11-25 ENCOUNTER — Encounter (HOSPITAL_COMMUNITY): Payer: Self-pay | Admitting: *Deleted

## 2023-11-27 DIAGNOSIS — R55 Syncope and collapse: Secondary | ICD-10-CM | POA: Diagnosis not present

## 2023-11-30 ENCOUNTER — Ambulatory Visit (HOSPITAL_COMMUNITY): Payer: Self-pay | Admitting: Cardiology

## 2023-11-30 NOTE — Addendum Note (Signed)
 Encounter addended by: Debarah Garrison MATSU, RN on: 11/30/2023 10:33 AM  Actions taken: Imaging Exam ended

## 2023-12-01 ENCOUNTER — Ambulatory Visit: Attending: Cardiology | Admitting: Physical Therapy

## 2023-12-01 DIAGNOSIS — R2689 Other abnormalities of gait and mobility: Secondary | ICD-10-CM | POA: Insufficient documentation

## 2023-12-01 DIAGNOSIS — M6281 Muscle weakness (generalized): Secondary | ICD-10-CM | POA: Insufficient documentation

## 2023-12-01 DIAGNOSIS — R262 Difficulty in walking, not elsewhere classified: Secondary | ICD-10-CM | POA: Diagnosis not present
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
## 2023-12-02 NOTE — Telephone Encounter (Addendum)
 Pt aware, agreeable, and verbalized understanding   ----- Message from Ezra Shuck sent at 11/30/2023  4:18 PM EDT ----- Atrial fibrillation noted (chronic), but nothing to explain syncope.  ----- Message ----- From: Shuck Ezra RAMAN, MD Sent: 11/30/2023   4:11 PM EDT To: Ezra RAMAN Shuck, MD

## 2023-12-04 ENCOUNTER — Other Ambulatory Visit: Payer: Self-pay | Admitting: Cardiovascular Disease

## 2023-12-04 ENCOUNTER — Other Ambulatory Visit: Payer: Self-pay | Admitting: Neurology

## 2023-12-06 ENCOUNTER — Other Ambulatory Visit
Admission: RE | Admit: 2023-12-06 | Discharge: 2023-12-06 | Disposition: A | Discharge status: Home / Self Care | Attending: Family | Admitting: Family

## 2023-12-06 ENCOUNTER — Ambulatory Visit: Payer: Self-pay | Admitting: Family

## 2023-12-06 DIAGNOSIS — I5032 Chronic diastolic (congestive) heart failure: Secondary | ICD-10-CM

## 2023-12-06 LAB — BASIC METABOLIC PANEL WITH GFR
Anion gap: 11 (ref 5–15)
BUN: 26 mg/dL — ABNORMAL HIGH (ref 8–23)
CO2: 28 mmol/L (ref 22–32)
Calcium: 9.8 mg/dL (ref 8.9–10.3)
Chloride: 104 mmol/L (ref 98–111)
Creatinine, Ser: 1.32 mg/dL — ABNORMAL HIGH (ref 0.44–1.00)
GFR, Estimated: 39 mL/min — ABNORMAL LOW (ref >60)
Glucose, Bld: 97 mg/dL (ref 70–99)
Potassium: 3.5 mmol/L (ref 3.5–5.1)
Sodium: 143 mmol/L (ref 135–145)

## 2023-12-06 MED ORDER — TORSEMIDE 20 MG PO TABS: ORAL_TABLET | ORAL | Status: AC

## 2023-12-06 NOTE — Telephone Encounter (Signed)
 Pt's daughter and caregiver, Ulla, answered for patient. She verbalized understanding and agreement to taking half a torsemide  daily (10 MG) and will only take another 10 MG daily PRN for weight gain, swelling, and SOB.  Pt's daughter states her mom is on a lower fluid restriction, less than 64 ounces daily, per Nephrology.

## 2023-12-06 NOTE — Telephone Encounter (Signed)
-----   Message from Ellouise DELENA Class sent at 12/06/2023 10:31 AM EDT ----- Potassium better. Kidney function a little worse. Decrease torsemide  to 10mg  daily with additional 10mg  PRN for weight gain, swelling or SOB. Make sure drinking 60-64 ounces of fluid daily.  ----- Message ----- From: Interface, Lab In [place] Sent: 12/06/2023  10:24 AM EDT To: Ellouise DELENA Class, FNP

## 2023-12-07 ENCOUNTER — Encounter: Payer: Self-pay | Admitting: Cardiology

## 2023-12-07 ENCOUNTER — Other Ambulatory Visit: Payer: Self-pay | Admitting: Cardiology

## 2023-12-07 DIAGNOSIS — I5032 Chronic diastolic (congestive) heart failure: Secondary | ICD-10-CM

## 2023-12-08 ENCOUNTER — Ambulatory Visit

## 2023-12-08 DIAGNOSIS — R2689 Other abnormalities of gait and mobility: Secondary | ICD-10-CM

## 2023-12-08 DIAGNOSIS — M6281 Muscle weakness (generalized): Secondary | ICD-10-CM

## 2023-12-08 DIAGNOSIS — R262 Difficulty in walking, not elsewhere classified: Secondary | ICD-10-CM | POA: Diagnosis not present
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
## 2023-12-09 ENCOUNTER — Ambulatory Visit (HOSPITAL_COMMUNITY)
Admission: RE | Admit: 2023-12-09 | Discharge: 2023-12-09 | Disposition: A | Discharge status: Home / Self Care | Source: Ambulatory Visit | Attending: Cardiology | Admitting: Cardiology

## 2023-12-09 DIAGNOSIS — I5032 Chronic diastolic (congestive) heart failure: Secondary | ICD-10-CM | POA: Diagnosis not present

## 2023-12-09 LAB — MYOCARDIAL AMYLOID PLANAR & SPECT: H/CL Ratio: 1.09

## 2023-12-09 MED ORDER — TECHNETIUM TC 99M PYROPHOSPHATE
21.0000 | Freq: Once | INTRAVENOUS | Status: AC
  Administered 2023-12-09: 21 via INTRAVENOUS

## 2023-12-12 ENCOUNTER — Telehealth: Payer: Self-pay

## 2023-12-12 ENCOUNTER — Ambulatory Visit: Payer: 59 | Admitting: Neurology

## 2023-12-12 MED ORDER — CALCIUM CARB-CHOLECALCIFEROL 600-20 MG-MCG PO TABS: ORAL_TABLET | ORAL | 1 refills | Status: DC

## 2023-12-12 MED ORDER — ATORVASTATIN CALCIUM 10 MG PO TABS: 10.0000 mg | ORAL_TABLET | Freq: Every day | ORAL | 0 refills | Status: DC

## 2023-12-12 MED ORDER — GABAPENTIN 100 MG PO CAPS: 100.0000 mg | ORAL_CAPSULE | Freq: Two times a day (BID) | ORAL | 1 refills | Status: DC

## 2023-12-12 NOTE — Telephone Encounter (Signed)
 Rx sent in for atorvastatin , gabapentin  and calcium .

## 2023-12-12 NOTE — Telephone Encounter (Signed)
 LM for daughter to let her knw

## 2023-12-12 NOTE — Telephone Encounter (Signed)
 Medication pended. Pt's daughter called office requesting medication be filed

## 2023-12-12 NOTE — Telephone Encounter (Signed)
 Notify - daughter - rx sent in.

## 2023-12-12 NOTE — Telephone Encounter (Signed)
 Copied from CRM [id number]. Topic: Clinical - Prescription Issue >> Dec 12, 2023 12:15 PM Rea C wrote: Reason for CRM: [facility] will be sending over a refill for patients prescriptions. Patient's daughter called in to let us  know that they should be sending over refill request for 3 medications that she's been waiting on for a week or so now. Atrovastatin, Gabepentin, and calcium . She told them to channge everything over to Dr. Freda name and office.   [facility] [address] [place] - [place], [place] - [address] AT [facility] [address] [place] [address] Phone: [phone] Fax: [phone]

## 2023-12-13 ENCOUNTER — Ambulatory Visit: Admitting: Physical Therapy

## 2023-12-13 NOTE — Telephone Encounter (Signed)
 Pt daughter is aware.

## 2023-12-14 ENCOUNTER — Other Ambulatory Visit: Payer: Self-pay | Admitting: Internal Medicine

## 2023-12-15 ENCOUNTER — Ambulatory Visit (HOSPITAL_COMMUNITY): Payer: Self-pay | Admitting: Cardiology

## 2023-12-19 ENCOUNTER — Ambulatory Visit: Admitting: Neurology

## 2023-12-27 ENCOUNTER — Ambulatory Visit

## 2023-12-27 DIAGNOSIS — R262 Difficulty in walking, not elsewhere classified: Secondary | ICD-10-CM

## 2023-12-27 DIAGNOSIS — R2689 Other abnormalities of gait and mobility: Secondary | ICD-10-CM | POA: Diagnosis not present

## 2023-12-27 DIAGNOSIS — M6281 Muscle weakness (generalized): Secondary | ICD-10-CM | POA: Diagnosis not present
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
## 2024-01-10 ENCOUNTER — Encounter: Payer: Self-pay | Admitting: Family

## 2024-01-10 ENCOUNTER — Ambulatory Visit: Attending: Cardiology | Admitting: Physical Therapy

## 2024-01-10 DIAGNOSIS — R2689 Other abnormalities of gait and mobility: Secondary | ICD-10-CM | POA: Diagnosis present

## 2024-01-10 DIAGNOSIS — R262 Difficulty in walking, not elsewhere classified: Secondary | ICD-10-CM | POA: Diagnosis present

## 2024-01-10 DIAGNOSIS — M6281 Muscle weakness (generalized): Secondary | ICD-10-CM | POA: Insufficient documentation

## 2024-01-10 MED ORDER — POTASSIUM CHLORIDE CRYS ER 10 MEQ PO TBCR: 20.0000 meq | EXTENDED_RELEASE_TABLET | Freq: Every day | ORAL | 5 refills | Status: AC
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
## 2024-01-12 ENCOUNTER — Ambulatory Visit: Admitting: Physical Therapy

## 2024-01-12 DIAGNOSIS — M6281 Muscle weakness (generalized): Secondary | ICD-10-CM

## 2024-01-12 DIAGNOSIS — R262 Difficulty in walking, not elsewhere classified: Secondary | ICD-10-CM

## 2024-01-12 DIAGNOSIS — R2689 Other abnormalities of gait and mobility: Secondary | ICD-10-CM
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
## 2024-01-17 ENCOUNTER — Ambulatory Visit: Admitting: Physical Therapy

## 2024-01-17 DIAGNOSIS — M6281 Muscle weakness (generalized): Secondary | ICD-10-CM

## 2024-01-17 DIAGNOSIS — R262 Difficulty in walking, not elsewhere classified: Secondary | ICD-10-CM

## 2024-01-17 DIAGNOSIS — R2689 Other abnormalities of gait and mobility: Secondary | ICD-10-CM
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
## 2024-01-19 ENCOUNTER — Ambulatory Visit: Admitting: Physical Therapy

## 2024-01-19 DIAGNOSIS — M6281 Muscle weakness (generalized): Secondary | ICD-10-CM

## 2024-01-19 DIAGNOSIS — R262 Difficulty in walking, not elsewhere classified: Secondary | ICD-10-CM

## 2024-01-19 DIAGNOSIS — R2689 Other abnormalities of gait and mobility: Secondary | ICD-10-CM
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
## 2024-01-23 ENCOUNTER — Telehealth: Payer: Self-pay | Admitting: Family

## 2024-01-23 NOTE — Telephone Encounter (Signed)
 Called to confirm/remind patient of their appointment at the [facility] on 01/24/24.   Appointment:   [x] Confirmed  [] Left mess   [] No answer/No voice mail  [] VM Full/unable to leave message  [] Phone not in service  Patient reminded to bring all medications and/or complete list.  Confirmed patient has transportation. Gave directions, instructed to utilize valet parking.

## 2024-01-24 ENCOUNTER — Ambulatory Visit: Payer: Self-pay | Admitting: Family

## 2024-01-24 ENCOUNTER — Encounter: Payer: Self-pay | Admitting: Family

## 2024-01-24 ENCOUNTER — Other Ambulatory Visit
Admission: RE | Admit: 2024-01-24 | Discharge: 2024-01-24 | Disposition: A | Discharge status: Home / Self Care | Source: Ambulatory Visit | Attending: Family | Admitting: Family

## 2024-01-24 ENCOUNTER — Ambulatory Visit: Admitting: Family

## 2024-01-24 VITALS — BP 132/84 | HR 72 | Wt 209.0 lb

## 2024-01-24 DIAGNOSIS — I4821 Permanent atrial fibrillation: Secondary | ICD-10-CM | POA: Diagnosis not present

## 2024-01-24 DIAGNOSIS — I5032 Chronic diastolic (congestive) heart failure: Secondary | ICD-10-CM | POA: Diagnosis not present

## 2024-01-24 DIAGNOSIS — E782 Mixed hyperlipidemia: Secondary | ICD-10-CM | POA: Diagnosis not present

## 2024-01-24 DIAGNOSIS — I10 Essential (primary) hypertension: Secondary | ICD-10-CM | POA: Diagnosis not present

## 2024-01-24 LAB — BASIC METABOLIC PANEL WITH GFR
Anion gap: 9 (ref 5–15)
BUN: 26 mg/dL — ABNORMAL HIGH (ref 8–23)
CO2: 30 mmol/L (ref 22–32)
Calcium: 9.7 mg/dL (ref 8.9–10.3)
Chloride: 105 mmol/L (ref 98–111)
Creatinine, Ser: 1.04 mg/dL — ABNORMAL HIGH (ref 0.44–1.00)
GFR, Estimated: 51 mL/min — ABNORMAL LOW (ref >60)
Glucose, Bld: 95 mg/dL (ref 70–99)
Potassium: 4.1 mmol/L (ref 3.5–5.1)
Sodium: 144 mmol/L (ref 135–145)

## 2024-01-24 NOTE — Patient Instructions (Addendum)
 Medication Changes:  No medication changes.   Lab Work:  Go over to the [facility]. Go pass the gift shop and have your blood work completed.  We will only call you if the results are abnormal or if the provider would like to make medication changes.  No news is good news.   Special Instructions // Education:  Wear compression socks everyday.  Elevate legs daily for a few hours.   Follow-Up in: 3 months with Dr. Rolan.  Our Doctors' schedules are NOT open yet for 3 months. We will place you on our recall list. Once they are available, we will call you to schedule your follow up appointment.    Thank you for choosing [facility].    At the [facility], you and your health needs are our priority. We have a designated team specialized in the treatment of Heart Failure. This Care Team includes your primary Heart Failure Specialized Cardiologist (physician), Advanced Practice Providers (APPs- Physician Assistants and Nurse Practitioners), and Pharmacist who all work together to provide you with the care you need, when you need it.   You may see any of the following providers on your designated Care Team at your next follow up:  Dr. Toribio Fuel Dr. Ezra Rolan Dr. Ria Commander Dr. Morene Brownie Ellouise Class, FNP Jaun Bash, RPH-CPP  Please be sure to bring in all your medications bottles to every appointment.   Need to Contact Us :  If you have any questions or concerns before your next appointment please send us  a message through [place] or call our office at [phone].    TO LEAVE A MESSAGE FOR THE NURSE SELECT OPTION 2, PLEASE LEAVE A MESSAGE INCLUDING: YOUR NAME DATE OF BIRTH CALL BACK NUMBER REASON FOR CALL**this is important as we prioritize the call backs  YOU WILL RECEIVE A CALL BACK THE SAME DAY AS LONG AS YOU CALL BEFORE 4:00 PM
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
## 2024-01-26 ENCOUNTER — Ambulatory Visit

## 2024-01-26 DIAGNOSIS — M6281 Muscle weakness (generalized): Secondary | ICD-10-CM | POA: Diagnosis not present

## 2024-01-26 DIAGNOSIS — R2689 Other abnormalities of gait and mobility: Secondary | ICD-10-CM

## 2024-01-26 DIAGNOSIS — R262 Difficulty in walking, not elsewhere classified: Secondary | ICD-10-CM
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
## 2024-01-27 ENCOUNTER — Telehealth: Payer: Self-pay

## 2024-01-27 DIAGNOSIS — R739 Hyperglycemia, unspecified: Secondary | ICD-10-CM

## 2024-01-27 DIAGNOSIS — E782 Mixed hyperlipidemia: Secondary | ICD-10-CM

## 2024-01-27 DIAGNOSIS — I10 Essential (primary) hypertension: Secondary | ICD-10-CM

## 2024-01-27 NOTE — Telephone Encounter (Signed)
 Copied from CRM #[id number]. Topic: Clinical - Request for Lab/Test Order >> Jan 27, 2024  1:54 PM Suzen RAMAN wrote: Reason for CRM: Patient daughter would like order place prior to patient upcoming physical. Please contact patient to schedule once orders are placed.   Lonell [phone].

## 2024-01-30 NOTE — Addendum Note (Signed)
 Addended by: GLENDIA ALLENA RAMAN on: 01/30/2024 05:45 AM   Modules accepted: Orders

## 2024-01-30 NOTE — Telephone Encounter (Signed)
 Placed order for labs. Can do 1-2 days before appt or same day -whichever prefers

## 2024-01-31 ENCOUNTER — Ambulatory Visit: Admitting: Physical Therapy

## 2024-02-01 ENCOUNTER — Other Ambulatory Visit (INDEPENDENT_AMBULATORY_CARE_PROVIDER_SITE_OTHER)

## 2024-02-01 DIAGNOSIS — E782 Mixed hyperlipidemia: Secondary | ICD-10-CM | POA: Diagnosis not present

## 2024-02-01 DIAGNOSIS — I10 Essential (primary) hypertension: Secondary | ICD-10-CM

## 2024-02-01 DIAGNOSIS — R739 Hyperglycemia, unspecified: Secondary | ICD-10-CM | POA: Diagnosis not present

## 2024-02-01 LAB — LIPID PANEL
Cholesterol: 180 mg/dL (ref 0–200)
HDL: 48.8 mg/dL (ref >39.00)
LDL Cholesterol: 112 mg/dL — ABNORMAL HIGH (ref 0–99)
NonHDL: 131.36
Total CHOL/HDL Ratio: 4
Triglycerides: 96 mg/dL (ref 0.0–149.0)
VLDL: 19.2 mg/dL (ref 0.0–40.0)

## 2024-02-01 LAB — BASIC METABOLIC PANEL WITH GFR
BUN: 25 mg/dL — ABNORMAL HIGH (ref 6–23)
CO2: 32 meq/L (ref 19–32)
Calcium: 9.4 mg/dL (ref 8.4–10.5)
Chloride: 105 meq/L (ref 96–112)
Creatinine, Ser: 1.27 mg/dL — ABNORMAL HIGH (ref 0.40–1.20)
GFR: 37.42 mL/min — ABNORMAL LOW (ref >60.00)
Glucose, Bld: 79 mg/dL (ref 70–99)
Potassium: 3.9 meq/L (ref 3.5–5.1)
Sodium: 146 meq/L — ABNORMAL HIGH (ref 135–145)

## 2024-02-01 LAB — HEMOGLOBIN A1C: Hgb A1c MFr Bld: 5.5 % (ref 4.6–6.5)

## 2024-02-01 LAB — HEPATIC FUNCTION PANEL
ALT: 16 U/L (ref 0–35)
AST: 25 U/L (ref 0–37)
Albumin: 3.7 g/dL (ref 3.5–5.2)
Alkaline Phosphatase: 107 U/L (ref 39–117)
Bilirubin, Direct: 0.2 mg/dL (ref 0.0–0.3)
Total Bilirubin: 1 mg/dL (ref 0.2–1.2)
Total Protein: 6.8 g/dL (ref 6.0–8.3)

## 2024-02-02 ENCOUNTER — Ambulatory Visit: Payer: Self-pay | Admitting: Internal Medicine

## 2024-02-02 ENCOUNTER — Ambulatory Visit

## 2024-02-03 NOTE — Telephone Encounter (Signed)
 Daughter of patient called back. Relayed lab results.

## 2024-02-04 ENCOUNTER — Other Ambulatory Visit: Payer: Self-pay | Admitting: Cardiovascular Disease

## 2024-02-07 ENCOUNTER — Ambulatory Visit: Admitting: Physical Therapy

## 2024-02-07 ENCOUNTER — Other Ambulatory Visit: Payer: Self-pay | Admitting: *Deleted

## 2024-02-07 NOTE — Telephone Encounter (Signed)
 LOV: 11/02/23  NOV: CPE on Thurs. 11/13 @ 10  Per note in September pt daughter requested that meds were switched to your name.  Lisinopril  was last filled by Dr Gollan for 45 tabs on 12/06/2023.

## 2024-02-08 MED ORDER — LISINOPRIL 20 MG PO TABS
ORAL_TABLET | ORAL | 2 refills | Status: AC
Start: 2024-02-08 — End: ?

## 2024-02-08 NOTE — Telephone Encounter (Signed)
 Rx ok'd for lisinopril 

## 2024-02-09 ENCOUNTER — Encounter: Payer: Self-pay | Admitting: Internal Medicine

## 2024-02-09 ENCOUNTER — Ambulatory Visit

## 2024-02-09 ENCOUNTER — Ambulatory Visit: Admitting: Internal Medicine

## 2024-02-09 VITALS — BP 124/74 | HR 87 | Temp 97.9°F | Ht 67.0 in | Wt 205.0 lb

## 2024-02-09 DIAGNOSIS — E782 Mixed hyperlipidemia: Secondary | ICD-10-CM

## 2024-02-09 DIAGNOSIS — I480 Paroxysmal atrial fibrillation: Secondary | ICD-10-CM

## 2024-02-09 DIAGNOSIS — I272 Pulmonary hypertension, unspecified: Secondary | ICD-10-CM

## 2024-02-09 DIAGNOSIS — I10 Essential (primary) hypertension: Secondary | ICD-10-CM | POA: Diagnosis not present

## 2024-02-09 DIAGNOSIS — R739 Hyperglycemia, unspecified: Secondary | ICD-10-CM

## 2024-02-09 DIAGNOSIS — N1831 Chronic kidney disease, stage 3a: Secondary | ICD-10-CM

## 2024-02-09 DIAGNOSIS — Z0000 Encounter for general adult medical examination without abnormal findings: Secondary | ICD-10-CM

## 2024-02-09 DIAGNOSIS — F02818 Dementia in other diseases classified elsewhere, unspecified severity, with other behavioral disturbance: Secondary | ICD-10-CM

## 2024-02-09 DIAGNOSIS — Z1231 Encounter for screening mammogram for malignant neoplasm of breast: Secondary | ICD-10-CM

## 2024-02-09 DIAGNOSIS — R404 Transient alteration of awareness: Secondary | ICD-10-CM

## 2024-02-09 DIAGNOSIS — G301 Alzheimer's disease with late onset: Secondary | ICD-10-CM

## 2024-02-09 DIAGNOSIS — E2839 Other primary ovarian failure: Secondary | ICD-10-CM

## 2024-02-09 MED ORDER — GABAPENTIN 100 MG PO CAPS: 100.0000 mg | ORAL_CAPSULE | Freq: Two times a day (BID) | ORAL | 1 refills | Status: AC

## 2024-02-09 MED ORDER — ATORVASTATIN CALCIUM 10 MG PO TABS: 10.0000 mg | ORAL_TABLET | Freq: Every day | ORAL | 1 refills | Status: AC

## 2024-02-09 NOTE — Assessment & Plan Note (Signed)
 Physical today 02/09/24. Mammogram 04/06/23 - Birads I.
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
## 2024-02-13 ENCOUNTER — Encounter: Payer: Self-pay | Admitting: Internal Medicine

## 2024-02-13 NOTE — Assessment & Plan Note (Signed)
 Had f/u - heart failure clinic. In 07/25, she had an unresponsive episode at home that per family lasted 30 minutes. She was admitted, telemetry showed no pauses or bradycardia. MRI of her head showed no CVA. She was not orthostatic. Echo in 07/25 showed EF 60-65%, RV normal size/systolic function, PASP 50 mmHg. Seen in [facility] 11/08/23 where lasix  was changed to torsemide  20mg  daily, Farxiga  10mg  daily was started. Propranolol  was stopped and metoprolol  succinate 25mg  daily was started. Myocardial perfusion study 09/25 not suggestive of cardiac amyloidosis.  Recommended to continue farxiga  10mg  and torsemide  10mg  q day with additional 10mg  prn. She has not needed the additional torsemide . Continue lisinopril  and toprol .

## 2024-02-13 NOTE — Assessment & Plan Note (Signed)
 Continues on eliquis . Rate controlled. Now on metoprolol . Stable. Has ben followed by Dr Gollan and Dr Venancio.

## 2024-02-13 NOTE — Assessment & Plan Note (Signed)
 Kidney function as outlined - GFR 37. Continue to avoid antiinflammatory medication. Follow metabolic panel.

## 2024-02-13 NOTE — Assessment & Plan Note (Signed)
 Conitnue aricept  and namenda . Evaluated - neurology.

## 2024-02-13 NOTE — Assessment & Plan Note (Signed)
 Low cholesterol diet and exercise. Continue lipitor. Follow lipid panel.

## 2024-02-13 NOTE — Assessment & Plan Note (Signed)
 Blood pressure as outlined. Continue - metoprolol , lisinopril , torsemide  and farxiga . Follow metabolic panel

## 2024-02-13 NOTE — Assessment & Plan Note (Signed)
 Low-carb diet and exercise.  Follow met b and A1c.

## 2024-02-14 ENCOUNTER — Ambulatory Visit

## 2024-02-16 ENCOUNTER — Ambulatory Visit

## 2024-02-21 ENCOUNTER — Ambulatory Visit

## 2024-02-28 ENCOUNTER — Ambulatory Visit: Admitting: Physical Therapy

## 2024-03-01 ENCOUNTER — Other Ambulatory Visit: Payer: Self-pay | Admitting: Cardiovascular Disease

## 2024-03-01 ENCOUNTER — Ambulatory Visit: Admitting: Physical Therapy

## 2024-03-02 NOTE — Telephone Encounter (Signed)
 Prescription refill request for Eliquis  received. Indication: a fib Last office visit: 01/24/24 Scr:  1.27 epic 02/01/24 Age: 89 Weight: 93kg

## 2024-03-03 ENCOUNTER — Other Ambulatory Visit: Payer: Self-pay | Admitting: Cardiovascular Disease

## 2024-03-06 ENCOUNTER — Ambulatory Visit: Admitting: Physical Therapy

## 2024-03-07 ENCOUNTER — Other Ambulatory Visit: Payer: Self-pay

## 2024-03-07 MED ORDER — DONEPEZIL HCL 10 MG PO TABS: 10.0000 mg | ORAL_TABLET | Freq: Every day | ORAL | 3 refills | Status: AC

## 2024-03-07 MED ORDER — MEMANTINE HCL 10 MG PO TABS: 10.0000 mg | ORAL_TABLET | Freq: Two times a day (BID) | ORAL | 3 refills | Status: AC

## 2024-03-08 ENCOUNTER — Ambulatory Visit: Admitting: Physical Therapy

## 2024-03-13 ENCOUNTER — Ambulatory Visit: Admitting: Physical Therapy

## 2024-03-14 ENCOUNTER — Ambulatory Visit

## 2024-03-14 VITALS — Ht 67.0 in | Wt 205.0 lb

## 2024-03-14 DIAGNOSIS — Z0000 Encounter for general adult medical examination without abnormal findings: Secondary | ICD-10-CM
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
## 2024-03-14 NOTE — Patient Instructions (Signed)
 Briana Howe,  Thank you for taking the time for your Medicare Wellness Visit. I appreciate your continued commitment to your health goals. Please review the care plan we discussed, and feel free to reach out if I can assist you further.  Please note that Annual Wellness Visits do not include a physical exam. Some assessments may be limited, especially if the visit was conducted virtually. If needed, we may recommend an in-person follow-up with your provider.  Ongoing Care Seeing your primary care provider every 3 to 6 months helps us  monitor your health and provide consistent, personalized care.  Consider updating your vaccines.   Referrals If a referral was made during today's visit and you haven't received any updates within two weeks, please contact the referred provider directly to check on the status.  Recommended Screenings:  Health Maintenance  Topic Date Due   DTaP/Tdap/Td vaccine (1 - Tdap) Never done   Pneumococcal Vaccine for age over 59 (1 of 2 - PCV) Never done   Zoster (Shingles) Vaccine (1 of 2) Never done   Osteoporosis screening with Bone Density Scan  Never done   COVID-19 Vaccine (4 - 2025-26 season) 11/28/2023   Breast Cancer Screening  04/05/2024   Flu Shot  06/26/2024*   Medicare Annual Wellness Visit  03/14/2025   Meningitis B Vaccine  Aged Out  *Topic was postponed. The date shown is not the original due date.       03/14/2024   11:25 AM  Advanced Directives  Does Patient Have a Medical Advance Directive? No    Vision: Annual vision screenings are recommended for early detection of glaucoma, cataracts, and diabetic retinopathy. These exams can also reveal signs of chronic conditions such as diabetes and high blood pressure.  Dental: Annual dental screenings help detect early signs of oral cancer, gum disease, and other conditions linked to overall health, including heart disease and diabetes.  Please see the attached documents for additional preventive  care recommendations.

## 2024-03-15 ENCOUNTER — Ambulatory Visit

## 2024-03-15 ENCOUNTER — Telehealth: Payer: Self-pay

## 2024-03-15 NOTE — Telephone Encounter (Signed)
 Copied from CRM #[id number]. Topic: Clinical - Medical Advice >> Mar 15, 2024  4:10 PM Rea ORN wrote: Reason for CRM: Pt daughter Lonell called to see why pt has an appt for met B lab draw on 12/29. Lonell is asking what is BMP checking for and why is it needed prior to March follow up appt. I did advise that pt's CPE appt, PCP stated she wanted pt to have this done.   Please call back to advise [phone]

## 2024-03-19 NOTE — Telephone Encounter (Signed)
 Called Patient's daughter and went over last lab results from Dr. Glendia that states she wanted to recheck this lab due to decreased kidney function. Daughter did not have any more concerns.

## 2024-03-26 ENCOUNTER — Other Ambulatory Visit

## 2024-03-27 ENCOUNTER — Ambulatory Visit: Admitting: Physical Therapy

## 2024-04-02 ENCOUNTER — Ambulatory Visit: Payer: Self-pay | Admitting: Internal Medicine

## 2024-04-02 ENCOUNTER — Other Ambulatory Visit (INDEPENDENT_AMBULATORY_CARE_PROVIDER_SITE_OTHER)

## 2024-04-02 DIAGNOSIS — N1831 Chronic kidney disease, stage 3a: Secondary | ICD-10-CM

## 2024-04-02 LAB — BASIC METABOLIC PANEL WITH GFR
BUN: 21 mg/dL (ref 6–23)
CO2: 34 meq/L — ABNORMAL HIGH (ref 19–32)
Calcium: 9.5 mg/dL (ref 8.4–10.5)
Chloride: 102 meq/L (ref 96–112)
Creatinine, Ser: 1.15 mg/dL (ref 0.40–1.20)
GFR: 42.11 mL/min — ABNORMAL LOW
Glucose, Bld: 89 mg/dL (ref 70–99)
Potassium: 3.9 meq/L (ref 3.5–5.1)
Sodium: 143 meq/L (ref 135–145)

## 2024-04-09 ENCOUNTER — Ambulatory Visit
Admission: RE | Admit: 2024-04-09 | Discharge: 2024-04-09 | Disposition: A | Source: Ambulatory Visit | Attending: Internal Medicine | Admitting: Internal Medicine

## 2024-04-09 ENCOUNTER — Ambulatory Visit
Admission: RE | Admit: 2024-04-09 | Discharge: 2024-04-09 | Disposition: A | Discharge status: Home / Self Care | Source: Ambulatory Visit | Attending: Internal Medicine | Admitting: Internal Medicine

## 2024-04-09 DIAGNOSIS — Z1231 Encounter for screening mammogram for malignant neoplasm of breast: Secondary | ICD-10-CM | POA: Diagnosis present

## 2024-04-09 DIAGNOSIS — E2839 Other primary ovarian failure: Secondary | ICD-10-CM | POA: Insufficient documentation

## 2024-05-02 ENCOUNTER — Ambulatory Visit: Admitting: Neurology

## 2024-05-04 ENCOUNTER — Encounter: Payer: Self-pay | Admitting: Neurology

## 2024-06-06 ENCOUNTER — Other Ambulatory Visit

## 2024-06-08 ENCOUNTER — Ambulatory Visit: Admitting: Internal Medicine

## 2024-08-07 ENCOUNTER — Ambulatory Visit: Admitting: Neurology
